# Patient Record
Sex: Female | Born: 1945 | Race: White | Hispanic: No | Marital: Married | Smoking: Never smoker
Health system: Southern US, Community
[De-identification: ages and names within clinical notes are randomized; demographics above are authoritative.]

## PROBLEM LIST (undated history)

## (undated) DIAGNOSIS — M1990 Unspecified osteoarthritis, unspecified site: Secondary | ICD-10-CM

## (undated) DIAGNOSIS — N819 Female genital prolapse, unspecified: Secondary | ICD-10-CM

## (undated) DIAGNOSIS — R3915 Urgency of urination: Secondary | ICD-10-CM

## (undated) HISTORY — PX: ABDOMINAL HYSTERECTOMY: SHX81

---
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
## 1973-01-03 HISTORY — PX: TUBAL LIGATION: SHX77

## 1991-01-04 HISTORY — PX: WISDOM TOOTH EXTRACTION: SHX21

## 1997-08-24 ENCOUNTER — Emergency Department (HOSPITAL_COMMUNITY): Admission: EM | Admit: 1997-08-24 | Discharge: 1997-08-24 | Payer: Self-pay | Admitting: Emergency Medicine

## 1997-08-27 ENCOUNTER — Ambulatory Visit (HOSPITAL_BASED_OUTPATIENT_CLINIC_OR_DEPARTMENT_OTHER): Admission: RE | Admit: 1997-08-27 | Discharge: 1997-08-27 | Payer: Self-pay | Admitting: Orthopedic Surgery

## 1998-01-03 HISTORY — PX: HAND SURGERY: SHX662

## 1998-06-29 ENCOUNTER — Other Ambulatory Visit: Admission: RE | Admit: 1998-06-29 | Discharge: 1998-06-29 | Payer: Self-pay | Admitting: Obstetrics and Gynecology

## 1999-08-16 ENCOUNTER — Other Ambulatory Visit: Admission: RE | Admit: 1999-08-16 | Discharge: 1999-08-16 | Payer: Self-pay | Admitting: Obstetrics and Gynecology

## 2000-11-08 ENCOUNTER — Other Ambulatory Visit: Admission: RE | Admit: 2000-11-08 | Discharge: 2000-11-08 | Payer: Self-pay | Admitting: Obstetrics and Gynecology

## 2001-12-12 ENCOUNTER — Other Ambulatory Visit: Admission: RE | Admit: 2001-12-12 | Discharge: 2001-12-12 | Payer: Self-pay | Admitting: Obstetrics and Gynecology

## 2005-06-29 ENCOUNTER — Other Ambulatory Visit: Admission: RE | Admit: 2005-06-29 | Discharge: 2005-06-29 | Payer: Self-pay | Admitting: Obstetrics and Gynecology

## 2012-01-04 LAB — HM COLONOSCOPY

## 2013-09-30 ENCOUNTER — Other Ambulatory Visit (HOSPITAL_COMMUNITY): Payer: Self-pay | Admitting: Obstetrics and Gynecology

## 2013-09-30 DIAGNOSIS — R3989 Other symptoms and signs involving the genitourinary system: Secondary | ICD-10-CM

## 2013-09-30 DIAGNOSIS — N8110 Cystocele, unspecified: Secondary | ICD-10-CM

## 2013-10-02 ENCOUNTER — Other Ambulatory Visit: Payer: Self-pay

## 2013-10-03 ENCOUNTER — Ambulatory Visit (HOSPITAL_COMMUNITY): Payer: Self-pay

## 2013-10-03 ENCOUNTER — Other Ambulatory Visit: Payer: Self-pay | Admitting: Urology

## 2013-10-03 DIAGNOSIS — N8111 Cystocele, midline: Secondary | ICD-10-CM

## 2013-10-07 ENCOUNTER — Ambulatory Visit
Admission: RE | Admit: 2013-10-07 | Discharge: 2013-10-07 | Disposition: A | Discharge status: Home / Self Care | Payer: Medicare Other | Source: Ambulatory Visit | Attending: Urology | Admitting: Urology

## 2013-10-07 DIAGNOSIS — N8111 Cystocele, midline: Secondary | ICD-10-CM

## 2013-12-03 ENCOUNTER — Other Ambulatory Visit: Payer: Self-pay | Admitting: Urology

## 2013-12-20 NOTE — Patient Instructions (Signed)
Taylor Mclaughlin  12/20/2013   Your procedure is scheduled on: 12/30/13  Report to [facility]  En[facility] and follow signs to               [place] at 5:15  AM.   Call this number if you have problems the morning of surgery [phone]   Remember:  Do not eat food or drink liquids :After Midnight.     Take these medicines the morning of surgery with A SIP OF WATER:                                You may not have any metal on your body including hair pins and              piercings  Do not wear jewelry, make-up, lotions, powders or perfumes.             Do not wear nail polish.  Do not shave  48 hours prior to surgery.              Men may shave face and neck.   Do not bring valuables to the hospital. [place].  Contacts, dentures or bridgework may not be worn into surgery.  Leave suitcase in the car. After surgery it may be brought to your room.     Patients discharged the day of surgery will not be allowed to drive home.  Name and phone number of your driver:  Special Instructions: N/A              Please read over the following fact sheets you were given: _____________________________________________________________________                                                     [place]  Before surgery, you can play an important role.  Because skin is not sterile, your skin needs to be as free of germs as possible.  You can reduce the number of germs on your skin by washing with CHG (chlorahexidine gluconate) soap before surgery.  CHG is an antiseptic cleaner which kills germs and bonds with the skin to continue killing germs even after washing. Please DO NOT use if you have an allergy to CHG or antibacterial soaps.  If your skin becomes reddened/irritated stop using the CHG and inform your nurse when you arrive at Short Stay. Do not shave (including legs and  underarms) for at least 48 hours prior to the first CHG shower.  You may shave your face. Please follow these instructions carefully:   1.  Shower with CHG Soap the night before surgery and the  morning of Surgery.   2.  If you choose to wash your hair, wash your hair first as usual with your  normal  Shampoo.   3.  After you shampoo, rinse your hair and body thoroughly to remove the  shampoo.  4.  Use CHG as you would any other liquid soap.  You can apply chg directly  to the skin and wash . Gently wash with scrungie or clean wascloth    5.  Apply the CHG Soap to your body ONLY FROM THE NECK DOWN.   Do not use on open                           Wound or open sores. Avoid contact with eyes, ears mouth and genitals (private parts).                        Genitals (private parts) with your normal soap.              6.  Wash thoroughly, paying special attention to the area where your surgery  will be performed.   7.  Thoroughly rinse your body with warm water from the neck down.   8.  DO NOT shower/wash with your normal soap after using and rinsing off  the CHG Soap .                9.  Pat yourself dry with a clean towel.             10.  Wear clean pajamas.             11.  Place clean sheets on your bed the night of your first shower and do not  sleep with pets.  Day of Surgery : Do not apply any lotions/deodorants the morning of surgery.  Please wear clean clothes to the hospital/surgery center.  FAILURE TO FOLLOW THESE INSTRUCTIONS MAY RESULT IN THE CANCELLATION OF YOUR SURGERY    PATIENT SIGNATURE_________________________________  ______________________________________________________________________    WHAT IS A BLOOD TRANSFUSION? Blood Transfusion Information  A transfusion is the replacement of blood or some of its parts. Blood is made up of multiple cells which provide different functions.  Red blood cells carry oxygen and are  used for blood loss replacement.  White blood cells fight against infection.  Platelets control bleeding.  Plasma helps clot blood.  Other blood products are available for specialized needs, such as hemophilia or other clotting disorders. BEFORE THE TRANSFUSION  Who gives blood for transfusions?   Healthy volunteers who are fully evaluated to make sure their blood is safe. This is blood bank blood. Transfusion therapy is the safest it has ever been in the practice of medicine. Before blood is taken from a donor, a complete history is taken to make sure that person has no history of diseases nor engages in risky social behavior (examples are intravenous drug use or sexual activity with multiple partners). The donor's travel history is screened to minimize risk of transmitting infections, such as malaria. The donated blood is tested for signs of infectious diseases, such as HIV and hepatitis. The blood is then tested to be sure it is compatible with you in order to minimize the chance of a transfusion reaction. If you or a relative donates blood, this is often done in anticipation of surgery and is not appropriate for emergency situations. It takes many days to process the donated blood. RISKS AND COMPLICATIONS Although transfusion therapy is very safe and saves many lives, the main dangers of transfusion include:   Getting an infectious disease.  Developing a transfusion reaction. This is an allergic reaction to something in the blood you were given. Every  precaution is taken to prevent this. The decision to have a blood transfusion has been considered carefully by your caregiver before blood is given. Blood is not given unless the benefits outweigh the risks. AFTER THE TRANSFUSION  Right after receiving a blood transfusion, you will usually feel much better and more energetic. This is especially true if your red blood cells have gotten low (anemic). The transfusion raises the level of the red  blood cells which carry oxygen, and this usually causes an energy increase.  The nurse administering the transfusion will monitor you carefully for complications. HOME CARE INSTRUCTIONS  No special instructions are needed after a transfusion. You may find your energy is better. Speak with your caregiver about any limitations on activity for underlying diseases you may have. SEEK MEDICAL CARE IF:   Your condition is not improving after your transfusion.  You develop redness or irritation at the intravenous (IV) site. SEEK IMMEDIATE MEDICAL CARE IF:  Any of the following symptoms occur over the next 12 hours:  Shaking chills.  You have a temperature by mouth above 102 F (38.9 C), not controlled by medicine.  Chest, back, or muscle pain.  People around you feel you are not acting correctly or are confused.  Shortness of breath or difficulty breathing.  Dizziness and fainting.  You get a rash or develop hives.  You have a decrease in urine output.  Your urine turns a dark color or changes to pink, red, or brown. Any of the following symptoms occur over the next 10 days:  You have a temperature by mouth above 102 F (38.9 C), not controlled by medicine.  Shortness of breath.  Weakness after normal activity.  The white part of the eye turns yellow (jaundice).  You have a decrease in the amount of urine or are urinating less often.  Your urine turns a dark color or changes to pink, red, or brown. Document Released: 12/18/1999 Document Revised: 03/14/2011 Document Reviewed: 08/06/2007 [facility] Patient Information 2014 [place], [place].  _______________________________________________________________________

## 2013-12-23 ENCOUNTER — Encounter (HOSPITAL_COMMUNITY)
Admission: RE | Admit: 2013-12-23 | Discharge: 2013-12-23 | Disposition: A | Discharge status: Home / Self Care | Payer: Medicare Other | Source: Ambulatory Visit | Attending: Urology | Admitting: Urology

## 2013-12-23 ENCOUNTER — Encounter (HOSPITAL_COMMUNITY): Payer: Self-pay

## 2013-12-23 DIAGNOSIS — Z01812 Encounter for preprocedural laboratory examination: Secondary | ICD-10-CM | POA: Insufficient documentation

## 2013-12-23 HISTORY — DX: Unspecified osteoarthritis, unspecified site: M19.90

## 2013-12-23 HISTORY — DX: Female genital prolapse, unspecified: N81.9

## 2013-12-23 HISTORY — DX: Urgency of urination: R39.15

## 2013-12-23 LAB — CBC
HEMATOCRIT: 37.3 % (ref 36.0–46.0)
Hemoglobin: 12.1 g/dL (ref 12.0–15.0)
MCH: 30.3 pg (ref 26.0–34.0)
MCHC: 32.4 g/dL (ref 30.0–36.0)
MCV: 93.3 fL (ref 78.0–100.0)
Platelets: 348 10*3/uL (ref 150–400)
RBC: 4 MIL/uL (ref 3.87–5.11)
RDW: 13.4 % (ref 11.5–15.5)
WBC: 6 10*3/uL (ref 4.0–10.5)

## 2013-12-23 LAB — ABO/RH: ABO/RH(D): B POS

## 2013-12-23 LAB — BASIC METABOLIC PANEL
Anion gap: 11 (ref 5–15)
BUN: 15 mg/dL (ref 6–23)
CALCIUM: 10.3 mg/dL (ref 8.4–10.5)
CO2: 28 meq/L (ref 19–32)
Chloride: 104 mEq/L (ref 96–112)
Creatinine, Ser: 0.62 mg/dL (ref 0.50–1.10)
GFR calc Af Amer: >90 mL/min (ref >90)
GFR calc non Af Amer: >90 mL/min (ref >90)
Glucose, Bld: 114 mg/dL — ABNORMAL HIGH (ref 70–99)
Potassium: 5.4 mEq/L — ABNORMAL HIGH (ref 3.7–5.3)
Sodium: 143 mEq/L (ref 137–147)

## 2013-12-24 LAB — URINE CULTURE
COLONY COUNT: NO GROWTH
Culture: NO GROWTH

## 2013-12-30 ENCOUNTER — Inpatient Hospital Stay (HOSPITAL_COMMUNITY): Payer: Medicare Other | Admitting: Certified Registered Nurse Anesthetist

## 2013-12-30 ENCOUNTER — Inpatient Hospital Stay (HOSPITAL_COMMUNITY)
Admission: RE | Admit: 2013-12-30 | Discharge: 2013-12-31 | DRG: 743 | Disposition: A | Discharge status: Home / Self Care | Payer: Medicare Other | Source: Ambulatory Visit | Attending: Urology | Admitting: Urology

## 2013-12-30 ENCOUNTER — Encounter (HOSPITAL_COMMUNITY)
Admission: RE | Disposition: A | Discharge status: Home / Self Care | Payer: Self-pay | Source: Ambulatory Visit | Attending: Urology

## 2013-12-30 ENCOUNTER — Encounter (HOSPITAL_COMMUNITY): Payer: Self-pay | Admitting: *Deleted

## 2013-12-30 DIAGNOSIS — N736 Female pelvic peritoneal adhesions (postinfective): Secondary | ICD-10-CM | POA: Diagnosis present

## 2013-12-30 DIAGNOSIS — M1990 Unspecified osteoarthritis, unspecified site: Secondary | ICD-10-CM | POA: Diagnosis present

## 2013-12-30 DIAGNOSIS — Z9851 Tubal ligation status: Secondary | ICD-10-CM | POA: Diagnosis not present

## 2013-12-30 DIAGNOSIS — N814 Uterovaginal prolapse, unspecified: Principal | ICD-10-CM | POA: Diagnosis present

## 2013-12-30 DIAGNOSIS — IMO0002 Reserved for concepts with insufficient information to code with codable children: Secondary | ICD-10-CM | POA: Diagnosis present

## 2013-12-30 HISTORY — PX: ROBOTIC ASSISTED LAPAROSCOPIC HYSTERECTOMY AND SALPINGECTOMY: SHX6379

## 2013-12-30 HISTORY — PX: CYSTO: SHX6284

## 2013-12-30 LAB — TYPE AND SCREEN
ABO/RH(D): B POS
ANTIBODY SCREEN: NEGATIVE

## 2013-12-30 SURGERY — ROBOTIC ASSISTED LAPAROSCOPIC HYSTERECTOMY AND SALPINGECTOMY
Anesthesia: General | Site: Ureter

## 2013-12-30 MED ORDER — DIPHENHYDRAMINE HCL 12.5 MG/5ML PO ELIX: 12.5000 mg | ORAL_SOLUTION | Freq: Four times a day (QID) | ORAL | Status: DC | PRN

## 2013-12-30 MED ORDER — SODIUM CHLORIDE 0.9 % IV SOLN: 250.0000 mL | INTRAVENOUS | Status: DC | PRN

## 2013-12-30 MED ORDER — PROMETHAZINE HCL 25 MG/ML IJ SOLN: 6.2500 mg | INTRAMUSCULAR | Status: DC | PRN

## 2013-12-30 MED ORDER — STERILE WATER FOR IRRIGATION IR SOLN
Status: DC | PRN
Start: 2013-12-30 — End: 2013-12-30
  Administered 2013-12-30: 3000 mL

## 2013-12-30 MED ORDER — FENTANYL CITRATE 0.05 MG/ML IJ SOLN
INTRAMUSCULAR | Status: DC | PRN
  Administered 2013-12-30: 50 ug via INTRAVENOUS
  Administered 2013-12-30 (×2): 100 ug via INTRAVENOUS

## 2013-12-30 MED ORDER — HYDROMORPHONE HCL 1 MG/ML IJ SOLN
INTRAMUSCULAR | Status: AC
  Administered 2013-12-30: 0.25 mg via INTRAVENOUS
  Filled 2013-12-30: qty 1

## 2013-12-30 MED ORDER — DEXAMETHASONE SODIUM PHOSPHATE 10 MG/ML IJ SOLN
INTRAMUSCULAR | Status: DC | PRN
  Administered 2013-12-30: 10 mg via INTRAVENOUS

## 2013-12-30 MED ORDER — GLYCOPYRROLATE 0.2 MG/ML IJ SOLN
INTRAMUSCULAR | Status: AC
  Filled 2013-12-30: qty 3

## 2013-12-30 MED ORDER — CEFAZOLIN SODIUM 1-5 GM-% IV SOLN
1.0000 g | Freq: Three times a day (TID) | INTRAVENOUS | Status: AC
  Administered 2013-12-30 – 2013-12-31 (×2): 1 g via INTRAVENOUS
  Filled 2013-12-30 (×2): qty 50

## 2013-12-30 MED ORDER — CEFAZOLIN SODIUM-DEXTROSE 2-3 GM-% IV SOLR
INTRAVENOUS | Status: AC
  Filled 2013-12-30: qty 50

## 2013-12-30 MED ORDER — LIDOCAINE HCL (CARDIAC) 20 MG/ML IV SOLN
INTRAVENOUS | Status: DC | PRN
  Administered 2013-12-30: 50 mg via INTRAVENOUS

## 2013-12-30 MED ORDER — ACETAMINOPHEN 10 MG/ML IV SOLN
1000.0000 mg | Freq: Once | INTRAVENOUS | Status: DC
  Filled 2013-12-30: qty 100

## 2013-12-30 MED ORDER — MIDAZOLAM HCL 2 MG/2ML IJ SOLN
INTRAMUSCULAR | Status: AC
  Filled 2013-12-30: qty 2

## 2013-12-30 MED ORDER — KETOROLAC TROMETHAMINE 30 MG/ML IJ SOLN: 30.0000 mg | Freq: Once | INTRAMUSCULAR | Status: DC | PRN

## 2013-12-30 MED ORDER — BUPIVACAINE LIPOSOME 1.3 % IJ SUSP
20.0000 mL | Freq: Once | INTRAMUSCULAR | Status: AC
  Administered 2013-12-30: 20 mL
  Filled 2013-12-30: qty 20

## 2013-12-30 MED ORDER — SENNOSIDES-DOCUSATE SODIUM 8.6-50 MG PO TABS
2.0000 | ORAL_TABLET | Freq: Every day | ORAL | Status: DC
  Administered 2013-12-30: 2 via ORAL
  Filled 2013-12-30 (×2): qty 2

## 2013-12-30 MED ORDER — DIPHENHYDRAMINE HCL 50 MG/ML IJ SOLN: 12.5000 mg | Freq: Four times a day (QID) | INTRAMUSCULAR | Status: DC | PRN

## 2013-12-30 MED ORDER — KETOROLAC TROMETHAMINE 15 MG/ML IJ SOLN
INTRAMUSCULAR | Status: AC
  Filled 2013-12-30: qty 1

## 2013-12-30 MED ORDER — OXYBUTYNIN CHLORIDE 5 MG PO TABS
5.0000 mg | ORAL_TABLET | Freq: Three times a day (TID) | ORAL | Status: DC | PRN
  Filled 2013-12-30: qty 1

## 2013-12-30 MED ORDER — SODIUM CHLORIDE 0.9 % IJ SOLN: 3.0000 mL | Freq: Two times a day (BID) | INTRAMUSCULAR | Status: DC

## 2013-12-30 MED ORDER — SODIUM CHLORIDE 0.9 % IJ SOLN
INTRAMUSCULAR | Status: AC
  Filled 2013-12-30: qty 20

## 2013-12-30 MED ORDER — ACETAMINOPHEN 10 MG/ML IV SOLN
INTRAVENOUS | Status: DC | PRN
  Administered 2013-12-30: 1000 mg via INTRAVENOUS

## 2013-12-30 MED ORDER — MIDAZOLAM HCL 5 MG/5ML IJ SOLN
INTRAMUSCULAR | Status: DC | PRN
  Administered 2013-12-30: 2 mg via INTRAVENOUS

## 2013-12-30 MED ORDER — SUCCINYLCHOLINE CHLORIDE 20 MG/ML IJ SOLN
INTRAMUSCULAR | Status: DC | PRN
  Administered 2013-12-30: 100 mg via INTRAVENOUS

## 2013-12-30 MED ORDER — HYDROMORPHONE HCL 1 MG/ML IJ SOLN
0.2500 mg | INTRAMUSCULAR | Status: DC | PRN
  Administered 2013-12-30: 0.25 mg via INTRAVENOUS
  Administered 2013-12-30: 0.5 mg via INTRAVENOUS

## 2013-12-30 MED ORDER — MIDAZOLAM HCL 5 MG/5ML IJ SOLN: INTRAMUSCULAR | Status: DC | PRN

## 2013-12-30 MED ORDER — NEOSTIGMINE METHYLSULFATE 10 MG/10ML IV SOLN
INTRAVENOUS | Status: DC | PRN
  Administered 2013-12-30: 4 mg via INTRAVENOUS

## 2013-12-30 MED ORDER — PROPOFOL 10 MG/ML IV BOLUS
INTRAVENOUS | Status: AC
  Filled 2013-12-30: qty 20

## 2013-12-30 MED ORDER — CEFAZOLIN SODIUM-DEXTROSE 2-3 GM-% IV SOLR
2.0000 g | INTRAVENOUS | Status: AC
  Administered 2013-12-30: 2 g via INTRAVENOUS

## 2013-12-30 MED ORDER — LACTATED RINGERS IV SOLN: INTRAVENOUS | Status: DC

## 2013-12-30 MED ORDER — MORPHINE SULFATE 2 MG/ML IJ SOLN: 2.0000 mg | INTRAMUSCULAR | Status: DC | PRN

## 2013-12-30 MED ORDER — GLYCOPYRROLATE 0.2 MG/ML IJ SOLN
INTRAMUSCULAR | Status: DC | PRN
  Administered 2013-12-30: .6 mg via INTRAVENOUS

## 2013-12-30 MED ORDER — ROCURONIUM BROMIDE 100 MG/10ML IV SOLN
INTRAVENOUS | Status: DC | PRN
  Administered 2013-12-30: 50 mg via INTRAVENOUS
  Administered 2013-12-30 (×2): 10 mg via INTRAVENOUS
  Administered 2013-12-30: 20 mg via INTRAVENOUS
  Administered 2013-12-30: 10 mg via INTRAVENOUS

## 2013-12-30 MED ORDER — DEXAMETHASONE SODIUM PHOSPHATE 10 MG/ML IJ SOLN
INTRAMUSCULAR | Status: AC
  Filled 2013-12-30: qty 1

## 2013-12-30 MED ORDER — LIDOCAINE HCL (CARDIAC) 20 MG/ML IV SOLN
INTRAVENOUS | Status: AC
  Filled 2013-12-30: qty 5

## 2013-12-30 MED ORDER — ROCURONIUM BROMIDE 100 MG/10ML IV SOLN
INTRAVENOUS | Status: AC
  Filled 2013-12-30: qty 1

## 2013-12-30 MED ORDER — ONDANSETRON HCL 4 MG/2ML IJ SOLN
INTRAMUSCULAR | Status: AC
  Filled 2013-12-30: qty 2

## 2013-12-30 MED ORDER — ONDANSETRON HCL 4 MG/2ML IJ SOLN
4.0000 mg | INTRAMUSCULAR | Status: DC | PRN
  Administered 2013-12-30: 4 mg via INTRAVENOUS
  Filled 2013-12-30: qty 2

## 2013-12-30 MED ORDER — OXYCODONE HCL 5 MG PO TABS: 5.0000 mg | ORAL_TABLET | ORAL | Status: DC | PRN

## 2013-12-30 MED ORDER — SODIUM CHLORIDE 0.9 % IJ SOLN: 3.0000 mL | INTRAMUSCULAR | Status: DC | PRN

## 2013-12-30 MED ORDER — LACTATED RINGERS IV SOLN
INTRAVENOUS | Status: DC | PRN
  Administered 2013-12-30 (×2): via INTRAVENOUS

## 2013-12-30 MED ORDER — FENTANYL CITRATE 0.05 MG/ML IJ SOLN
INTRAMUSCULAR | Status: AC
  Filled 2013-12-30: qty 5

## 2013-12-30 MED ORDER — SODIUM CHLORIDE 0.9 % IV SOLN
INTRAVENOUS | Status: DC
  Administered 2013-12-30 (×2): 1000 mL via INTRAVENOUS

## 2013-12-30 MED ORDER — LACTATED RINGERS IR SOLN
Status: DC | PRN
  Administered 2013-12-30: 1000 mL

## 2013-12-30 MED ORDER — KETOROLAC TROMETHAMINE 15 MG/ML IJ SOLN
15.0000 mg | Freq: Four times a day (QID) | INTRAMUSCULAR | Status: DC
  Administered 2013-12-30 – 2013-12-31 (×4): 15 mg via INTRAVENOUS
  Filled 2013-12-30 (×5): qty 1

## 2013-12-30 MED ORDER — ACETAMINOPHEN 10 MG/ML IV SOLN
1000.0000 mg | Freq: Four times a day (QID) | INTRAVENOUS | Status: AC
  Administered 2013-12-30 – 2013-12-31 (×4): 1000 mg via INTRAVENOUS
  Filled 2013-12-30 (×5): qty 100

## 2013-12-30 MED ORDER — ONDANSETRON HCL 4 MG/2ML IJ SOLN
INTRAMUSCULAR | Status: DC | PRN
  Administered 2013-12-30: 4 mg via INTRAVENOUS

## 2013-12-30 MED ORDER — NEOSTIGMINE METHYLSULFATE 10 MG/10ML IV SOLN
INTRAVENOUS | Status: AC
  Filled 2013-12-30: qty 1

## 2013-12-30 MED ORDER — PROPOFOL 10 MG/ML IV BOLUS
INTRAVENOUS | Status: DC | PRN
  Administered 2013-12-30: 100 mg via INTRAVENOUS

## 2013-12-30 SURGICAL SUPPLY — 46 items
CABLE HIGH FREQUENCY MONO STRZ (ELECTRODE) ×4 IMPLANT
CHLORAPREP W/TINT 26ML (MISCELLANEOUS) ×4 IMPLANT
CORDS BIPOLAR (ELECTRODE) ×4 IMPLANT
COVER SURGICAL LIGHT HANDLE (MISCELLANEOUS) ×6 IMPLANT
COVER TIP SHEARS 8 DVNC (MISCELLANEOUS) ×2 IMPLANT
COVER TIP SHEARS 8MM DA VINCI (MISCELLANEOUS) ×2
DRAPE SURG IRRIG POUCH 19X23 (DRAPES) ×4 IMPLANT
DRAPE TABLE BACK 44X90 PK DISP (DRAPES) ×2 IMPLANT
DRSG TEGADERM 6X8 (GAUZE/BANDAGES/DRESSINGS) ×10 IMPLANT
ELECT REM PT RETURN 9FT ADLT (ELECTROSURGICAL) ×4
ELECTRODE REM PT RTRN 9FT ADLT (ELECTROSURGICAL) ×2 IMPLANT
GAUZE SPONGE 2X2 8PLY STRL LF (GAUZE/BANDAGES/DRESSINGS) ×2 IMPLANT
GLOVE BIO SURGEON STRL SZ 6.5 (GLOVE) ×6 IMPLANT
GLOVE BIO SURGEONS STRL SZ 6.5 (GLOVE) ×2
GLOVE BIOGEL M STRL SZ7.5 (GLOVE) ×12 IMPLANT
GOWN STRL REUS W/ TWL XL LVL3 (GOWN DISPOSABLE) ×6 IMPLANT
GOWN STRL REUS W/TWL XL LVL3 (GOWN DISPOSABLE) ×12
HEMOSTAT SURGICEL 4X8 (HEMOSTASIS) ×2 IMPLANT
HOLDER FOLEY CATH W/STRAP (MISCELLANEOUS) ×4 IMPLANT
KIT ACCESSORY DA VINCI DISP (KITS) ×2
KIT ACCESSORY DVNC DISP (KITS) ×2 IMPLANT
MANIPULATOR UTERINE 4.5 ZUMI (MISCELLANEOUS) ×2 IMPLANT
MESH Y UPSYLON VAGINAL (Mesh General) ×2 IMPLANT
OCCLUDER COLPOPNEUMO (BALLOONS) ×2 IMPLANT
PACK ROBOT UROLOGY CUSTOM (CUSTOM PROCEDURE TRAY) ×4 IMPLANT
PEN SKIN MARKING BROAD (MISCELLANEOUS) ×4 IMPLANT
SET TUBE IRRIG SUCTION NO TIP (IRRIGATION / IRRIGATOR) ×4 IMPLANT
SOLUTION ELECTROLUBE (MISCELLANEOUS) ×4 IMPLANT
SPONGE GAUZE 2X2 STER 10/PKG (GAUZE/BANDAGES/DRESSINGS) ×2
SURGIFLO W/THROMBIN 8M KIT (HEMOSTASIS) ×4 IMPLANT
SUT ETHIBOND 0 (SUTURE) ×2 IMPLANT
SUT MNCRL AB 4-0 PS2 18 (SUTURE) ×4 IMPLANT
SUT PROLENE 0 CT 2 (SUTURE) ×4 IMPLANT
SUT VIC AB 0 CT1 27 (SUTURE) ×12
SUT VIC AB 0 CT1 27XBRD ANTBC (SUTURE) ×6 IMPLANT
SUT VIC AB 0 CT1 36 (SUTURE) ×2 IMPLANT
SUT VIC AB 2-0 CT2 27 (SUTURE) ×2 IMPLANT
SUT VIC AB 2-0 SH 27 (SUTURE) ×40
SUT VIC AB 2-0 SH 27X BRD (SUTURE) IMPLANT
SUT VICRYL 0 27 CT2 27 ABS (SUTURE) ×2 IMPLANT
SUT VICRYL 0 UR6 27IN ABS (SUTURE) ×6 IMPLANT
SYR 50ML LL SCALE MARK (SYRINGE) ×4 IMPLANT
SYR BULB IRRIGATION 50ML (SYRINGE) IMPLANT
TOWEL OR 17X26 10 PK STRL BLUE (TOWEL DISPOSABLE) ×6 IMPLANT
TRAY FOLEY CATH 14FRSI W/METER (CATHETERS) ×4 IMPLANT
WATER STERILE IRR 1500ML POUR (IV SOLUTION) ×8 IMPLANT

## 2013-12-30 NOTE — Anesthesia Preprocedure Evaluation (Addendum)
Anesthesia Evaluation  Patient identified by MRN, date of birth, ID band Patient awake    Reviewed: Allergy & Precautions, H&P , NPO status , Patient's Chart, lab work & pertinent test results  Airway Mallampati: II  TM Distance: >3 FB Neck ROM: Full    Dental no notable dental hx.    Pulmonary neg pulmonary ROS,  breath sounds clear to auscultation  Pulmonary exam normal       Cardiovascular negative cardio ROS  Rhythm:Regular Rate:Normal     Neuro/Psych negative neurological ROS  negative psych ROS   GI/Hepatic negative GI ROS, Neg liver ROS,   Endo/Other  negative endocrine ROS  Renal/GU negative Renal ROS  negative genitourinary   Musculoskeletal negative musculoskeletal ROS (+)   Abdominal   Peds negative pediatric ROS (+)  Hematology negative hematology ROS (+)   Anesthesia Other Findings   Reproductive/Obstetrics negative OB ROS                            Anesthesia Physical Anesthesia Plan  ASA: II  Anesthesia Plan: General   Post-op Pain Management:    Induction: Intravenous  Airway Management Planned: Oral ETT  Additional Equipment:   Intra-op Plan:   Post-operative Plan: Extubation in OR  Informed Consent: I have reviewed the patients History and Physical, chart, labs and discussed the procedure including the risks, benefits and alternatives for the proposed anesthesia with the patient or authorized representative who has indicated his/her understanding and acceptance.   Dental advisory given  Plan Discussed with: CRNA and Surgeon  Anesthesia Plan Comments:         Anesthesia Quick Evaluation

## 2013-12-30 NOTE — Op Note (Signed)
Preoperative diagnosis:  1. Symptomatic cystocele  2. Uterine prolapse  Postoperative diagnosis:  1. Same   Procedure: 1. Robotic-assisted laparoscopic supracervical hysterectomy and salpingo-ectomy 2. Robotic-assisted laparoscopic sacral colpopexy 3. Cystoscopy  Surgeon: Ardis Hughs, MD First assistant: Dr. Curt Bears, M.D.  Anesthesia: General  Complications: None  Intraoperative findings: Minimal adhesions of the bowel within the pelvis which are easily taken down.  [place] mesh used for the sacrocolpopexy.  EBL: 150 mL  Specimens: Supracervical hysterectomy and salpingo-ectomy  Indication: Taylor Mclaughlin is a 68 y.o. patient with symptomatic pelvic organ prolapse. Her physical exam demonstrated a grade 3/4 cystocele with uterine prolapse to the level of the hymen. She had no demonstrable urinary incontinence on urodynamics.  After reviewing the management options for treatment, she elected to proceed with the above surgical procedure(s). We have discussed the potential benefits and risks of the procedure, side effects of the proposed treatment, the likelihood of the patient achieving the goals of the procedure, and any potential problems that might occur during the procedure or recuperation. Informed consent has been obtained.  Description of procedure:  The patient was taken to the operating room and general anesthesia was induced.  The patient was placed in the dorsal lithotomy position, prepped and draped in the usual sterile fashion, and preoperative antibiotics were administered. A preoperative time-out was performed.   A Foley catheter was then placed and placed to gravity drainage. I then made a periumbilical incision carrying the dissection down to the patient's fascia with electrocautery.  Once in the fascia, the fascia was incised and 2-0 Prolene sutures were placed through the fascia.  Then holding up on the fascial sutures and using the visual  obturator access was gained into the peritoneum.  The abdomen was insufflated and the remaining ports placed under digital guidance.  2 ports were placed lateral to the umbilicus on the right proximally 10 cm apart.  The most lateral port was approximately 3 cm above the anterior iliac spine.  2 additional ports were placed in the patient's right side in comparable positions to the most lateral port on the right was a 12 mm port.the robot was then docked at an angle from the leg obliquely along the side of the left leg. We then began our surgery by cleaning up some of the pelvic adhesions to the small bowel and colon.  Once this was completed I started dissecting at the sacral promontory located 3 cm medial to the location where the ureter crosses over the iliac vessels at the pelvic brim. The posterior peritoneum was incised and the sacral prominence cleared off an area taking care to avoid the middle sacral vessels and the iliac branches.  I then created a posterior peritoneal tunnel starting at the sacral promontory and tunneling down the right pelvic sidewall down into the pelvis breaking back through the posterior peritoneum around the vesico-vaginal junction posteriorly.  I then continued the posterior dissection retracting down on the rectum and finding the avascular plane between the posterior vaginal wall and the rectum.  I carried this dissection down as far as I could to along the area of the perineal body. Then focused my attention to the uterus and hysterectomy.  I first started by taking the right round ligament with a series of by polar cautery.  I then dissected the anterior leaf of the broad ligament slightly more proximal and then distally down across the anterior mucosa salpinx and the internal cervical os.  I  then took of the uterine ovarian ligament on the right and dissected free thethe right salpinxwhich was ultimately removed and sent as a separate specimen.  Once the anterior leaf of the  broad ligament had been completely dissected on the patient's right and a small bladder flap had been created anteriorly attention was turned to the left side where a similar dissection was carried out.  I then turned my attention to the anterior plane between the anterior vaginal wall and the bladder.  I was able to obtain access to the avascular plane and with a combination of both monopolar cautery and blunt dissection was able to clean and nice down to the bladder neck.  I then turned my attention back to the patient's uterus and skeletonized the right uterine artery and vein and then took this with a series of bipolar moves.  I then performed a similar uterus pedicle ligation on the left.  This point I was able to identify the patient's cervix and came through supracervical with monopolar cautery.once the uterus was freed from all its attachments it was pushed into the left paracolic gutter and our attention was turned to placing the wire mesh.  Mesh was measured at approximately 6 cm anteriorly and 9 cm posteriorly and I cut this on the back table.  The mesh was then placed into the patient's abdomen through the assistant port and the anterior leaf was secured down onto the anterior vaginal wall with the apex at the bladder neck.  The posterior leaf was then secured down on the posterior vaginal wall.  These were sewn down with 2-0 Vicryl.  Between 6 and 8 were done on each side.  At this point I then went back to the previously dissected sacral promontory and posterior peritoneal tunnel and inserted a instrument through the tunnel and grasped the end of the mesh at the vaginal cuff and pull it up to the sacrum.  I then checked to ensure that the sacral mesh was not too tight by performing a vaginal exam.  I then secured the sacral leg of the mesh using a 0 Prolene.  I then reapproximated the posterior peritoneum with a 2-0 Vicryl in a running fashion around the sacral promontory.  The pelvic peritoneum was  closed using a pursestring.  A small Endo Catch bag was then gently passed through the assistant port and the uterus was placed in the bag.  The bag was then brought out through the camera port once the trochars were removed.  We then made a slightly larger extraction incision to remove the uterus.  The fascia was then closed with 0 Vicryl in a figure-of-eight fashion.  The skin was closed with 4-0 Monocryl's.  Dermabond was applied to the incision and exparel injected into the incisions.  The patient was subsequently extubated and returned to the PACU in excellent condition.   Ardis Hughs, M.D.

## 2013-12-30 NOTE — H&P (Signed)
Reason For Visit pre-op visit   History of Present Illness 68F with symptomatic POP who returns for discussion of surgery. She has a symptomatic cystocele with difficulting voiding and pelvic pain/pressure. She denies any difficulty with bowel movements. She denies any stress incontinence but does occasionally have urge with mild leakage. She underwent UDS showing no occult SUI. She had a small capacity bladder with some overactivity. She has had regular pap smears which have all been normal. She underwent a uterine ultrasound which also was normal.   Past Medical History Problems  1. History of Diverticulosis (K57.90) 2. History of arthritis (Z87.39)  Surgical History Problems  1. History of Hand Repair Opponensplasty 2. History of Oral Surgery Tooth Extraction 3. History of Tonsillectomy 4. History of Tubal Ligation  Current Meds 1. Calcium 600 + D TABS;  Therapy: (Recorded:08Sep2015) to Recorded 2. Centrum Silver TABS;  Therapy: (Recorded:08Sep2015) to Recorded 3. Fish Oil CAPS;  Therapy: (Recorded:08Sep2015) to Recorded 4. Glucosamine Chondroitin TABS;  Therapy: (Recorded:21Sep2015) to Recorded 5. Probiotic CAPS; only as needed;  Therapy: (Recorded:08Sep2015) to Recorded  Allergies Medication  1. No Known Drug Allergies  Family History Problems  1. Family history of Parkinson's disease dementia : Father  Social History Problems  1. Alcohol use (F10.99)   2 per week (1/2 glass of wine) 2. Caffeine use (F15.90) 3. Father deceased   68 yo dec parkinson's 4. Married 5. Mother deceased   2070-01-30yo suicide 6. Never a smoker 7. Number of children   2 sons- 40yo, 19 yo , 1 dtr- 57 yo  Review of Systems No changes in pts bowel habits, neurological changes, or progressive lower urinary tract symptoms.    Vitals Vital Signs [Data Includes: Last 1 Day]  Recorded: [id number] 01:19PM  Blood Pressure: 130 / 76 Temperature: 97.4 F Heart Rate: 71  Physical  Exam Constitutional: Well nourished and well developed . No acute distress.  Pulmonary: No respiratory distress.  Cardiovascular:. No peripheral edema.  Skin: Normal skin turgor, no visible rash and no visible skin lesions.  Neuro/Psych:. Mood and affect are appropriate.    Results/Data Urine [Data Includes: Last 1 Day]   [id number]  COLOR YELLOW   APPEARANCE CLEAR   SPECIFIC GRAVITY 1.025   pH 5.5   GLUCOSE NEG mg/dL  BILIRUBIN NEG   KETONE NEG mg/dL  BLOOD SMALL   PROTEIN NEG mg/dL  UROBILINOGEN 0.2 mg/dL  NITRITE NEG   LEUKOCYTE ESTERASE NEG   SQUAMOUS EPITHELIAL/HPF RARE   WBC 0-2 WBC/hpf  RBC 0-2 RBC/hpf  BACTERIA RARE   CRYSTALS NONE SEEN   CASTS NONE SEEN    Urinalysis is normal   Assessment Patient has symptomatic pelvic organ prolapse.   Plan Cystocele, midline  1. URINE CULTURE; Status:Hold For - Specimen/Data Collection,Appointment; Requested  for:10Dec2015;  2. Follow-up Schedule Surgery Office  Follow-up  Status: Complete  Done: [id number] Health Maintenance  3. UA With REFLEX; [Do Not Release]; Status:Complete;   Done: [id number] 01:11PM  Discussion/Summary The plan for this patient is to perform a robotic-assisted laparoscopic hysterectomy and sacral colpopexy. Given that she has not had any abnormal Pap smears, it is reasonable to leave the cervix and perform a supracervical hysterectomy. Further, we discussed leaving her recent tacked as well. I went over the procedure with the patient detail. I discussed some of the risks with her including failure, worsening voiding symptoms, urinary incontinence, damage to the surrounding structures including her ureters and her intestines. I also discussed with her the expected postoperative  course including hospitalization as well as recovery. The patient is well aware that this will be a proctored case by Dr. Mikle Bosworth. Having heard all of this, the patient wishes to proceed.

## 2013-12-30 NOTE — Interval H&P Note (Signed)
History and Physical Interval Note:  12/30/2013 6:37 AM  Taylor Mclaughlin  has presented today for surgery, with the diagnosis of pelvic floor prolapse  The various methods of treatment have been discussed with the patient and family. After consideration of risks, benefits and other options for treatment, the patient has consented to  Procedure(s): ROBOTIC ASSISTED LAPAROSCOPIC HYSTERECTOMY AND SACROCOLPOPEXY (N/A) as a surgical intervention .  The patient's history has been reviewed, patient examined, no change in status, stable for surgery.  I have reviewed the patient's chart and labs.  Questions were answered to the patient's satisfaction.     Louis Meckel W

## 2013-12-30 NOTE — Anesthesia Postprocedure Evaluation (Signed)
  Anesthesia Post-op Note  Patient: Taylor Mclaughlin  Procedure(s) Performed: Procedure(s) (LRB): ROBOTIC ASSISTED LAPAROSCOPIC HYSTERECTOMY AND SACROCOLPOPEXY (N/A) CYSTO (N/A)  Patient Location: PACU  Anesthesia Type: General  Level of Consciousness: awake and alert   Airway and Oxygen Therapy: Patient Spontanous Breathing  Post-op Pain: mild  Post-op Assessment: Post-op Vital signs reviewed, Patient's Cardiovascular Status Stable, Respiratory Function Stable, Patent Airway and No signs of Nausea or vomiting  Last Vitals:  Filed Vitals:   12/30/13 1800  BP: 105/49  Pulse: 63  Temp: 36.6 C  Resp: 18    Post-op Vital Signs: stable   Complications: No apparent anesthesia complications

## 2013-12-30 NOTE — Transfer of Care (Signed)
Immediate Anesthesia Transfer of Care Note  Patient: Taylor Mclaughlin  Procedure(s) Performed: Procedure(s): ROBOTIC ASSISTED LAPAROSCOPIC HYSTERECTOMY AND SACROCOLPOPEXY (N/A) CYSTO (N/A)  Patient Location: PACU  Anesthesia Type:General  Level of Consciousness: awake, alert  and oriented  Airway & Oxygen Therapy: Patient Spontanous Breathing and Patient connected to face mask oxygen  Post-op Assessment: Report given to PACU RN and Post -op Vital signs reviewed and stable  Post vital signs: Reviewed and stable  Complications: No apparent anesthesia complications

## 2013-12-31 ENCOUNTER — Encounter (HOSPITAL_COMMUNITY): Payer: Self-pay | Admitting: Urology

## 2013-12-31 LAB — HEMOGLOBIN AND HEMATOCRIT, BLOOD
HCT: 27.4 % — ABNORMAL LOW (ref 36.0–46.0)
Hemoglobin: 9.6 g/dL — ABNORMAL LOW (ref 12.0–15.0)

## 2013-12-31 MED ORDER — SENNOSIDES-DOCUSATE SODIUM 8.6-50 MG PO TABS: 2.0000 | ORAL_TABLET | Freq: Every day | ORAL | Status: DC

## 2013-12-31 MED ORDER — OXYCODONE HCL 5 MG PO TABS: 5.0000 mg | ORAL_TABLET | ORAL | Status: DC | PRN

## 2013-12-31 NOTE — Discharge Summary (Signed)
Date of admission: 12/30/2013  Date of discharge: 12/31/2013  Admission diagnosis: cystocele, uterine prolapse  Discharge diagnosis:  As above, s/p RAL hysterectomy and sacrocolpopexy  Secondary diagnoses:  Patient Active Problem List   Diagnosis Date Noted  . Cystocele 12/30/2013    History and Physical: For full details, please see admission history and physical. Briefly, Taylor Mclaughlin is a 68 y.o. year old patient with symptomatic pelvic organ prolapse.  She presented for the above surgery  Hospital Course: Patient tolerated the procedure well.  She was then transferred to the floor after an uneventful PACU stay.  Her hospital course was uncomplicated.  On POD#1  she had met discharge criteria: was eating a regular diet, was up and ambulating independently,  pain was well controlled, was voiding without a catheter, and was ready to for discharge.  PE on day of discharge: NAD Abdomen soft Incisions c/d/i Foley draining clear yellow urine  Laboratory values:   Recent Labs  12/31/13 0403  HGB 9.6*  HCT 27.4*   No results for input(s): NA, K, CL, CO2, GLUCOSE, BUN, CREATININE, CALCIUM in the last 72 hours. No results for input(s): LABPT, INR in the last 72 hours. No results for input(s): LABURIN in the last 72 hours. Results for orders placed or performed during the hospital encounter of 12/23/13  Urine culture     Status: None   Collection Time: 12/23/13 10:09 AM  Result Value Ref Range Status   Specimen Description URINE, CLEAN CATCH  Final   Special Requests NONE  Final   Culture  Setup Time   Final    12/23/2013 13:31 Performed at [place] Performed at [facility]   Final   Culture NO GROWTH Performed at [facility]   Final   Report Status 12/24/2013 FINAL  Final    Disposition: Home  Discharge instruction: The patient was instructed to be ambulatory but told to refrain from heavy lifting, strenuous  activity, or driving.   Discharge medications:   Medication List    TAKE these medications        CALCIUM-VITAMIN D PO  Take 1 tablet by mouth daily.     FISH OIL PO  Take 1 capsule by mouth daily.     GLUCOSAMINE CHONDROITIN COMPLX PO  Take 1 tablet by mouth daily.     multivitamin with minerals Tabs tablet  Take 1 tablet by mouth daily.     oxyCODONE 5 MG immediate release tablet  Commonly known as:  Oxy IR/ROXICODONE  Take 1 tablet (5 mg total) by mouth every 4 (four) hours as needed for moderate pain.     senna-docusate 8.6-50 MG per tablet  Commonly known as:  Senokot-S  Take 2 tablets by mouth at bedtime.        Followup:      Follow-up Information    Follow up with Ardis Hughs, MD On 01/13/2014.   Specialty:  Urology   Why:  1pm   Contact information:   [place] [address] [phone]

## 2013-12-31 NOTE — Care Management Note (Signed)
    Page 1 of 1   12/31/2013     11:22:56 AM CARE MANAGEMENT NOTE 12/31/2013  Patient:  Taylor Mclaughlin, Taylor Mclaughlin   Account Number:  [id number]  Date Initiated:  12/31/2013  Documentation initiated by:  Sunday Spillers  Subjective/Objective Assessment:   68 yo female admitted s/p robotic hysterectomy, salpingo-ectomy, sacral colpopexy, lysis of adhesions. PTA lived at home with spouse.     Action/Plan:   Home when stable   Anticipated DC Date:  01/03/2014   Anticipated DC Plan:  [place]  CM consult      Choice offered to / List presented to:             Status of service:  Completed, signed off Medicare Important Message given?   (If response is "NO", the following Medicare IM given date fields will be blank) Date Medicare IM given:   Medicare IM given by:   Date Additional Medicare IM given:   Additional Medicare IM given by:    Discharge Disposition:  HOME/SELF CARE  Per UR Regulation:  Reviewed for med. necessity/level of care/duration of stay  If discussed at [place] of Stay Meetings, dates discussed:    Comments:

## 2013-12-31 NOTE — Discharge Instructions (Signed)

## 2013-12-31 NOTE — Plan of Care (Signed)
Problem: Discharge Progression Outcomes Goal: Tolerating diet Outcome: Completed/Met Date Met:  12/31/13 Pt ate regular diet this am and tolerated well.

## 2013-12-31 NOTE — Progress Notes (Signed)
Pt given written discharge instructions and this nurse verbally went over the main points on the discharge paperwork. Pt also given 2 prescriptions to take home with her with instructions on the medications. Pt agreed with paperwork and has no further questions at this time. Pt signed discharge paperwork. Pt given minimal assistance to get dressed and pt wheeled out in wheelchair to her husbands car. Stand by assistance given as pt got into vehicle.

## 2015-04-28 DIAGNOSIS — M25561 Pain in right knee: Secondary | ICD-10-CM | POA: Diagnosis not present

## 2015-04-28 DIAGNOSIS — M179 Osteoarthritis of knee, unspecified: Secondary | ICD-10-CM | POA: Diagnosis not present

## 2015-07-09 DIAGNOSIS — H2513 Age-related nuclear cataract, bilateral: Secondary | ICD-10-CM | POA: Diagnosis not present

## 2015-10-09 DIAGNOSIS — Z23 Encounter for immunization: Secondary | ICD-10-CM | POA: Diagnosis not present

## 2015-11-10 DIAGNOSIS — R05 Cough: Secondary | ICD-10-CM | POA: Diagnosis not present

## 2015-11-10 DIAGNOSIS — Z1231 Encounter for screening mammogram for malignant neoplasm of breast: Secondary | ICD-10-CM | POA: Diagnosis not present

## 2015-11-10 DIAGNOSIS — M810 Age-related osteoporosis without current pathological fracture: Secondary | ICD-10-CM | POA: Diagnosis not present

## 2015-11-10 DIAGNOSIS — Z13220 Encounter for screening for lipoid disorders: Secondary | ICD-10-CM | POA: Diagnosis not present

## 2015-11-10 DIAGNOSIS — Z0000 Encounter for general adult medical examination without abnormal findings: Secondary | ICD-10-CM | POA: Diagnosis not present

## 2015-11-10 DIAGNOSIS — R0982 Postnasal drip: Secondary | ICD-10-CM | POA: Diagnosis not present

## 2016-01-19 DIAGNOSIS — R109 Unspecified abdominal pain: Secondary | ICD-10-CM | POA: Diagnosis not present

## 2016-01-19 DIAGNOSIS — R1030 Lower abdominal pain, unspecified: Secondary | ICD-10-CM | POA: Diagnosis not present

## 2016-01-28 DIAGNOSIS — D72829 Elevated white blood cell count, unspecified: Secondary | ICD-10-CM | POA: Diagnosis not present

## 2016-02-26 ENCOUNTER — Encounter: Payer: Self-pay | Admitting: Family Medicine

## 2016-02-26 ENCOUNTER — Ambulatory Visit (INDEPENDENT_AMBULATORY_CARE_PROVIDER_SITE_OTHER): Payer: PPO | Admitting: Family Medicine

## 2016-02-26 VITALS — BP 121/63 | HR 66 | Temp 98.0°F | Resp 16 | Ht 62.0 in | Wt 117.0 lb

## 2016-02-26 DIAGNOSIS — M8580 Other specified disorders of bone density and structure, unspecified site: Secondary | ICD-10-CM

## 2016-02-26 DIAGNOSIS — Z1231 Encounter for screening mammogram for malignant neoplasm of breast: Secondary | ICD-10-CM | POA: Diagnosis not present

## 2016-02-26 DIAGNOSIS — M1990 Unspecified osteoarthritis, unspecified site: Secondary | ICD-10-CM | POA: Insufficient documentation

## 2016-02-26 DIAGNOSIS — Z86010 Personal history of colonic polyps: Secondary | ICD-10-CM

## 2016-02-26 DIAGNOSIS — Z862 Personal history of diseases of the blood and blood-forming organs and certain disorders involving the immune mechanism: Secondary | ICD-10-CM

## 2016-02-26 DIAGNOSIS — R7309 Other abnormal glucose: Secondary | ICD-10-CM | POA: Diagnosis not present

## 2016-02-26 DIAGNOSIS — Z23 Encounter for immunization: Secondary | ICD-10-CM | POA: Diagnosis not present

## 2016-02-26 NOTE — Assessment & Plan Note (Signed)
New.  Pt reports she was told 'after the fact' that she had a polyp but that she was to follow up in 10 yrs- which confused her.  Pt is not interested in going back to her GI.  Is interested in pursuing Cologuard at next visit.

## 2016-02-26 NOTE — Assessment & Plan Note (Signed)
New.  Pt's most bothersome joint is her L hip.  She was previously taking glucosamine chondroitin but stopped this and pain has worsened some.  Will restart this.  Continue fish oil.  Increase Vit D.  Add Tumeric.  NSAIDs prn.  Reviewed supportive care and red flags that should prompt return.  Pt expressed understanding and is in agreement w/ plan.

## 2016-02-26 NOTE — Patient Instructions (Addendum)
Schedule your complete physical for fall (Sept/Oct) Keep up the good work on healthy diet and regular exercise- you look great! We'll plan on doing the cologuard at your next physical For joint pain- restart the Glucosamine Chondroitin, increase the Vit D to 3,000-5,000 units daily, continue the Fish Oil, and add Tumeric We'll call you with your bone density and mammogram appts Call with any questions or concerns Welcome!  We're glad to have you!!!

## 2016-02-26 NOTE — Progress Notes (Signed)
   Subjective:    Patient ID: Taylor Mclaughlin, female    DOB: 12-19-1945, 71 y.o.   MRN: [id number]  HPI New to establish.  Previous MD- [facility] since 1991  UTD on CPE (Sept/Oct)  Health Maintenance- pt had colonoscopy in 2014 w/ Dr Collene Mares (due 2019).  Hx of benign colon polyp.  Due for mammo and DEXA.    Hx of anemia- this occurred in post-op setting.  Never required blood products.  Is not nor has been on iron.  Denies excessive fatigue or changes to skin/hair/nails.  Elevated glucose- last sugar reading from 2015 was 114.  Unclear if this was fasting or not.  No personal or family hx of diabetes.    Hip pain- pt reports this is ongoing.  Will take Naproxen prn w/ some relief.   Review of Systems Denies CP, SOB, HAs, visual changes, edema, abd pain, N/V.    Objective:   Physical Exam  Constitutional: She is oriented to person, place, and time. She appears well-developed and well-nourished. No distress.  HENT:  Head: Normocephalic and atraumatic.  Eyes: Conjunctivae and EOM are normal. Pupils are equal, round, and reactive to light.  Neck: Normal range of motion. Neck supple. No thyromegaly present.  Cardiovascular: Normal rate, regular rhythm, normal heart sounds and intact distal pulses.   No murmur heard. Pulmonary/Chest: Effort normal and breath sounds normal. No respiratory distress.  Abdominal: Soft. She exhibits no distension. There is no tenderness.  Musculoskeletal: She exhibits no edema.  Lymphadenopathy:    She has no cervical adenopathy.  Neurological: She is alert and oriented to person, place, and time.  Skin: Skin is warm and dry.  Psychiatric: She has a normal mood and affect. Her behavior is normal.  Vitals reviewed.         Assessment & Plan:

## 2016-02-26 NOTE — Progress Notes (Signed)
Pre visit review using our clinic review tool, if applicable. No additional management support is needed unless otherwise documented below in the visit note. 

## 2016-02-26 NOTE — Assessment & Plan Note (Signed)
Noted on last labs but she is not sure if she was fasting.  No personal or family hx of diabetes.  Encouraged healthy diet and regular exercise.  Will follow.

## 2016-03-07 ENCOUNTER — Encounter: Payer: Self-pay | Admitting: General Practice

## 2016-04-05 ENCOUNTER — Ambulatory Visit
Admission: RE | Admit: 2016-04-05 | Discharge: 2016-04-05 | Disposition: A | Discharge status: Home / Self Care | Payer: PPO | Source: Ambulatory Visit | Attending: Family Medicine | Admitting: Family Medicine

## 2016-04-05 DIAGNOSIS — Z1231 Encounter for screening mammogram for malignant neoplasm of breast: Secondary | ICD-10-CM | POA: Diagnosis not present

## 2016-04-05 DIAGNOSIS — Z780 Asymptomatic menopausal state: Secondary | ICD-10-CM | POA: Diagnosis not present

## 2016-04-05 DIAGNOSIS — M8580 Other specified disorders of bone density and structure, unspecified site: Secondary | ICD-10-CM

## 2016-04-05 DIAGNOSIS — M810 Age-related osteoporosis without current pathological fracture: Secondary | ICD-10-CM | POA: Diagnosis not present

## 2016-04-13 ENCOUNTER — Ambulatory Visit (INDEPENDENT_AMBULATORY_CARE_PROVIDER_SITE_OTHER): Payer: PPO | Admitting: Family Medicine

## 2016-04-13 ENCOUNTER — Encounter: Payer: Self-pay | Admitting: Family Medicine

## 2016-04-13 VITALS — BP 121/83 | HR 69 | Temp 97.9°F | Resp 17 | Ht 62.0 in | Wt 115.5 lb

## 2016-04-13 DIAGNOSIS — M7062 Trochanteric bursitis, left hip: Secondary | ICD-10-CM

## 2016-04-13 DIAGNOSIS — M810 Age-related osteoporosis without current pathological fracture: Secondary | ICD-10-CM | POA: Diagnosis not present

## 2016-04-13 MED ORDER — MELOXICAM 15 MG PO TABS: 15.0000 mg | ORAL_TABLET | Freq: Every day | ORAL | 1 refills | Status: DC

## 2016-04-13 NOTE — Patient Instructions (Signed)
Follow up as needed/scheduled We'll start the Prolia process and let you know when it gets approved Start the Meloxicam once daily- take w/ food- for inflammation Ice the hip as needed Start Biotin daily for hair growth Call with any questions or concerns Hang in there!!!

## 2016-04-13 NOTE — Progress Notes (Signed)
Pre visit review using our clinic review tool, if applicable. No additional management support is needed unless otherwise documented below in the visit note. 

## 2016-04-13 NOTE — Progress Notes (Signed)
   Subjective:    Patient ID: Taylor Mclaughlin, female    DOB: 13-Oct-1945, 72 y.o.   MRN: [id number]  HPI Osteoporosis- recent DEXA shows T score of -3.  Pt was previously prescribed Boniva once monthly but is not on anything at this time w/ exception of Ca and Vit D.  L hip pain- pt had similar sxs ~2 yrs ago and required use of a cane at that time.  Resolved after time.  Unable to lie on L side due to pain, unable to put pressure w/o discomfort.  Difficulty w/ stairs.  Hx of sciatica on R which does cause her to alter her gait.  Has not tried OTC anti-inflammatories.   Review of Systems For ROS see HPI     Objective:   Physical Exam  Constitutional: She is oriented to person, place, and time. She appears well-developed and well-nourished. No distress.  Cardiovascular: Intact distal pulses.   Musculoskeletal: She exhibits tenderness (TTP over L greater trochanteric bursa). She exhibits no edema or deformity.  Neurological: She is alert and oriented to person, place, and time.  Skin: Skin is warm and dry. No erythema.  Psychiatric: She has a normal mood and affect. Her behavior is normal. Thought content normal.  Vitals reviewed.         Assessment & Plan:

## 2016-04-14 DIAGNOSIS — M810 Age-related osteoporosis without current pathological fracture: Secondary | ICD-10-CM | POA: Insufficient documentation

## 2016-04-14 DIAGNOSIS — M7062 Trochanteric bursitis, left hip: Secondary | ICD-10-CM | POA: Insufficient documentation

## 2016-04-14 NOTE — Assessment & Plan Note (Signed)
New.  Pt's sxs and PE consistent w/ bursitis.  Start daily Meloxicam.  Ice.  If no improvement will refer to ortho for evaluation and tx.  Pt expressed understanding and is in agreement w/ plan.

## 2016-04-14 NOTE — Assessment & Plan Note (Signed)
Pt's recent DEXA shows osteoporosis w/ T score of -3.  After discussion of tx options, she wants to proceed w/ Prolia injxns.  Will arrange for her.

## 2016-05-17 ENCOUNTER — Telehealth: Payer: Self-pay | Admitting: General Practice

## 2016-05-17 NOTE — Telephone Encounter (Signed)
Received Prolia authorization today. Will order med and call pt to schedule.

## 2016-05-31 ENCOUNTER — Encounter: Payer: Self-pay | Admitting: Family Medicine

## 2016-06-01 ENCOUNTER — Ambulatory Visit (INDEPENDENT_AMBULATORY_CARE_PROVIDER_SITE_OTHER): Payer: PPO | Admitting: *Deleted

## 2016-06-01 DIAGNOSIS — M810 Age-related osteoporosis without current pathological fracture: Secondary | ICD-10-CM | POA: Diagnosis not present

## 2016-06-01 MED ORDER — DENOSUMAB 60 MG/ML ~~LOC~~ SOLN
60.0000 mg | Freq: Once | SUBCUTANEOUS | Status: AC
  Administered 2016-06-01: 60 mg via SUBCUTANEOUS

## 2016-06-09 ENCOUNTER — Other Ambulatory Visit: Payer: Self-pay | Admitting: Family Medicine

## 2016-07-11 DIAGNOSIS — H2513 Age-related nuclear cataract, bilateral: Secondary | ICD-10-CM | POA: Diagnosis not present

## 2016-08-05 ENCOUNTER — Other Ambulatory Visit: Payer: Self-pay | Admitting: Family Medicine

## 2016-09-23 ENCOUNTER — Telehealth: Payer: Self-pay | Admitting: Family Medicine

## 2016-09-23 ENCOUNTER — Encounter: Payer: Self-pay | Admitting: Family Medicine

## 2016-09-23 ENCOUNTER — Ambulatory Visit (INDEPENDENT_AMBULATORY_CARE_PROVIDER_SITE_OTHER): Payer: PPO | Admitting: Family Medicine

## 2016-09-23 VITALS — BP 130/78 | HR 66 | Temp 98.0°F | Resp 16 | Ht 62.0 in | Wt 113.1 lb

## 2016-09-23 DIAGNOSIS — H539 Unspecified visual disturbance: Secondary | ICD-10-CM | POA: Diagnosis not present

## 2016-09-23 NOTE — Telephone Encounter (Signed)
[facility] Patient Name: NALAH MACIOCE DOB: [birth year] Initial Comment Caller States her blood pressure has been running higher then normal, would like to come in and have it checked Nurse Assessment Nurse: Dimas Chyle, RN, Dellis Filbert Date/Time Eilene Ghazi Time): 09/23/2016 8:19:18 AM Confirm and document reason for call. If symptomatic, describe symptoms. ---Caller states her blood pressure has been running higher then normal, would like to come in and have it checked. Last night B/P was 142/73. Current 137/73. Does the patient have any new or worsening symptoms? ---Yes Will a triage be completed? ---Yes Related visit to physician within the last 2 weeks? ---No Does the PT have any chronic conditions? (i.e. diabetes, asthma, etc.) ---No Is this a behavioral health or substance abuse call? ---No Guidelines Guideline Title Affirmed Question Affirmed Notes High Blood Pressure [6] Systolic BP >= 834 OR Diastolic >= 80 AND [1] not taking BP medications Final Disposition User See PCP within 2 Unk Pinto, RN, Dellis Filbert Comments Appointment scheduled with PCP for 09/23/16 at 2:15 p.m. Caller Disagree/Comply Comply Caller Understands Yes PreDisposition Call Doctor

## 2016-09-23 NOTE — Patient Instructions (Signed)
Follow up as needed or as scheduled Please call Dr Oswaldo Conroy on Monday and get an appt to evaluate the eye If you have any symptoms this weekend- visual changes, severe headache, numbness or tingling, weakness, etc- please go to the ER to be seen Call with any questions or concerns Hang in there!!!

## 2016-09-23 NOTE — Telephone Encounter (Signed)
FYI

## 2016-09-23 NOTE — Progress Notes (Signed)
Pre visit review using our clinic review tool, if applicable. No additional management support is needed unless otherwise documented below in the visit note. 

## 2016-09-23 NOTE — Progress Notes (Signed)
   Subjective:    Patient ID: Taylor Mclaughlin, female    DOB: Mar 26, 1945, 71 y.o.   MRN: [id number]  HPI Visual changes- last night 'my R eye just went crazy'.  Pt reports R eye was blurry and 'doing something weird around the edges'.  L eye remained in focus.  Pt was 'really freaked out'.  sxs seemed to clear after 3-5 minutes.  No residual sxs and no recurrent sxs.  Pt checked BP after the episode and it was 142/73.  Pt had formal eye exam this spring and 'everything was fine'.  During episode no dizziness, no CP, no slurring of speech, no facial droop or drooling.   Review of Systems For ROS see HPI     Objective:   Physical Exam  Constitutional: She is oriented to person, place, and time. She appears well-developed and well-nourished. No distress.  HENT:  Head: Normocephalic and atraumatic.  Eyes: Pupils are equal, round, and reactive to light. Conjunctivae and EOM are normal. Right eye exhibits no discharge. Left eye exhibits no discharge.  Neurological: She is alert and oriented to person, place, and time. She has normal reflexes. No cranial nerve deficit. Coordination normal.  Skin: Skin is warm and dry.  Psychiatric: She has a normal mood and affect. Her behavior is normal. Thought content normal.  Vitals reviewed.         Assessment & Plan:  Transient visual disturbance- new.  Pt had a 3-5 minute episode last night during which time her vision was 'really weird' and 'blurry around the edges'.  Denies visual field cut.  sxs resolved spontaneously and she had no associated neuro sxs that would indicate TIA or stroke.  She is asymptomatic today- although appropriately anxious about the episode.  Discussed that her mild BP elevation would not be responsible for this.  She is going to call her eye doctor and get an appt on Monday but in the interim, if she has any recurrence of sxs, she will go to ER immediately.  Pt expressed understanding and is in agreement w/ plan.

## 2016-09-26 DIAGNOSIS — G43B1 Ophthalmoplegic migraine, intractable: Secondary | ICD-10-CM | POA: Diagnosis not present

## 2016-10-04 ENCOUNTER — Ambulatory Visit (INDEPENDENT_AMBULATORY_CARE_PROVIDER_SITE_OTHER): Payer: PPO

## 2016-10-04 DIAGNOSIS — Z23 Encounter for immunization: Secondary | ICD-10-CM

## 2016-10-05 ENCOUNTER — Other Ambulatory Visit: Payer: Self-pay | Admitting: Family Medicine

## 2016-10-25 ENCOUNTER — Encounter: Payer: Self-pay | Admitting: Family Medicine

## 2016-10-26 ENCOUNTER — Ambulatory Visit: Payer: PPO | Admitting: Family Medicine

## 2016-11-04 ENCOUNTER — Other Ambulatory Visit: Payer: Self-pay | Admitting: General Practice

## 2016-11-04 MED ORDER — MELOXICAM 15 MG PO TABS: 15.0000 mg | ORAL_TABLET | Freq: Every day | ORAL | 0 refills | Status: DC

## 2017-01-28 ENCOUNTER — Encounter: Payer: Self-pay | Admitting: Family Medicine

## 2017-02-01 ENCOUNTER — Ambulatory Visit (INDEPENDENT_AMBULATORY_CARE_PROVIDER_SITE_OTHER): Payer: PPO | Admitting: Family Medicine

## 2017-02-01 ENCOUNTER — Encounter: Payer: Self-pay | Admitting: Family Medicine

## 2017-02-01 ENCOUNTER — Other Ambulatory Visit: Payer: Self-pay

## 2017-02-01 ENCOUNTER — Ambulatory Visit (INDEPENDENT_AMBULATORY_CARE_PROVIDER_SITE_OTHER): Payer: PPO

## 2017-02-01 VITALS — BP 130/81 | HR 63 | Temp 98.0°F | Resp 16 | Ht 62.0 in | Wt 110.4 lb

## 2017-02-01 VITALS — BP 130/81 | HR 63 | Temp 98.0°F | Resp 16 | Wt 110.0 lb

## 2017-02-01 DIAGNOSIS — D2230 Melanocytic nevi of unspecified part of face: Secondary | ICD-10-CM | POA: Diagnosis not present

## 2017-02-01 DIAGNOSIS — Z0000 Encounter for general adult medical examination without abnormal findings: Secondary | ICD-10-CM | POA: Insufficient documentation

## 2017-02-01 DIAGNOSIS — Z9189 Other specified personal risk factors, not elsewhere classified: Secondary | ICD-10-CM | POA: Diagnosis not present

## 2017-02-01 DIAGNOSIS — R7309 Other abnormal glucose: Secondary | ICD-10-CM

## 2017-02-01 DIAGNOSIS — M810 Age-related osteoporosis without current pathological fracture: Secondary | ICD-10-CM | POA: Diagnosis not present

## 2017-02-01 DIAGNOSIS — Z23 Encounter for immunization: Secondary | ICD-10-CM

## 2017-02-01 DIAGNOSIS — D649 Anemia, unspecified: Secondary | ICD-10-CM

## 2017-02-01 DIAGNOSIS — H9193 Unspecified hearing loss, bilateral: Secondary | ICD-10-CM

## 2017-02-01 DIAGNOSIS — Z1159 Encounter for screening for other viral diseases: Secondary | ICD-10-CM | POA: Diagnosis not present

## 2017-02-01 DIAGNOSIS — H9190 Unspecified hearing loss, unspecified ear: Secondary | ICD-10-CM | POA: Insufficient documentation

## 2017-02-01 LAB — LIPID PANEL
CHOL/HDL RATIO: 3
CHOLESTEROL: 196 mg/dL (ref 0–200)
HDL: 64.7 mg/dL (ref >39.00)
LDL CALC: 116 mg/dL — AB (ref 0–99)
NONHDL: 131.63
Triglycerides: 77 mg/dL (ref 0.0–149.0)
VLDL: 15.4 mg/dL (ref 0.0–40.0)

## 2017-02-01 LAB — CBC WITH DIFFERENTIAL/PLATELET
BASOS ABS: 0 10*3/uL (ref 0.0–0.1)
Basophils Relative: 0.6 % (ref 0.0–3.0)
EOS PCT: 1.4 % (ref 0.0–5.0)
Eosinophils Absolute: 0.1 10*3/uL (ref 0.0–0.7)
HEMATOCRIT: 36.6 % (ref 36.0–46.0)
Hemoglobin: 12.2 g/dL (ref 12.0–15.0)
LYMPHS PCT: 28.8 % (ref 12.0–46.0)
Lymphs Abs: 1.6 10*3/uL (ref 0.7–4.0)
MCHC: 33.3 g/dL (ref 30.0–36.0)
MCV: 91.9 fl (ref 78.0–100.0)
MONOS PCT: 9.3 % (ref 3.0–12.0)
Monocytes Absolute: 0.5 10*3/uL (ref 0.1–1.0)
NEUTROS ABS: 3.3 10*3/uL (ref 1.4–7.7)
Neutrophils Relative %: 59.9 % (ref 43.0–77.0)
PLATELETS: 339 10*3/uL (ref 150.0–400.0)
RBC: 3.98 Mil/uL (ref 3.87–5.11)
RDW: 12.7 % (ref 11.5–15.5)
WBC: 5.5 10*3/uL (ref 4.0–10.5)

## 2017-02-01 LAB — BASIC METABOLIC PANEL
BUN: 17 mg/dL (ref 6–23)
CALCIUM: 9.5 mg/dL (ref 8.4–10.5)
CO2: 29 mEq/L (ref 19–32)
Chloride: 101 mEq/L (ref 96–112)
Creatinine, Ser: 0.58 mg/dL (ref 0.40–1.20)
GFR: 108.75 mL/min (ref >60.00)
GLUCOSE: 95 mg/dL (ref 70–99)
POTASSIUM: 4.3 meq/L (ref 3.5–5.1)
SODIUM: 138 meq/L (ref 135–145)

## 2017-02-01 LAB — HEPATIC FUNCTION PANEL
ALT: 13 U/L (ref 0–35)
AST: 23 U/L (ref 0–37)
Albumin: 4.5 g/dL (ref 3.5–5.2)
Alkaline Phosphatase: 61 U/L (ref 39–117)
BILIRUBIN DIRECT: 0.2 mg/dL (ref 0.0–0.3)
TOTAL PROTEIN: 7.5 g/dL (ref 6.0–8.3)
Total Bilirubin: 0.9 mg/dL (ref 0.2–1.2)

## 2017-02-01 LAB — TSH: TSH: 0.91 u[IU]/mL (ref 0.35–4.50)

## 2017-02-01 LAB — VITAMIN D 25 HYDROXY (VIT D DEFICIENCY, FRACTURES): VITD: 56.41 ng/mL (ref 30.00–100.00)

## 2017-02-01 MED ORDER — ZOSTER VAC RECOMB ADJUVANTED 50 MCG/0.5ML IM SUSR: 0.5000 mL | Freq: Once | INTRAMUSCULAR | 1 refills | Status: AC

## 2017-02-01 NOTE — Progress Notes (Signed)
   Subjective:    Patient ID: Taylor Mclaughlin, female    DOB: 04/26/1945, 72 y.o.   MRN: [id number]  HPI CPE- UTD on colonoscopy, mammo, flu, and pneumonia vaccines.   Review of Systems Patient reports no vision changes, adenopathy,fever, weight change,  persistant/recurrent hoarseness , swallowing issues, chest pain, palpitations, edema, persistant/recurrent cough, hemoptysis, dyspnea (rest/exertional/paroxysmal nocturnal), gastrointestinal bleeding (melena, rectal bleeding), abdominal pain, significant heartburn, bowel changes, GU symptoms (dysuria, hematuria, incontinence), Gyn symptoms (abnormal  bleeding, pain),  syncope, focal weakness, memory loss, numbness & tingling, skin/hair/nail changes, abnormal bruising or bleeding, anxiety, or depression.   + hearing loss bilaterally    Objective:   Physical Exam General Appearance:    Alert, cooperative, no distress, appears stated age  Head:    Normocephalic, without obvious abnormality, atraumatic  Eyes:    PERRL, conjunctiva/corneas clear, EOM's intact, fundi    benign, both eyes  Ears:    Normal TM's and external ear canals, both ears  Nose:   Nares normal, septum midline, mucosa normal, no drainage    or sinus tenderness  Throat:   Lips, mucosa, and tongue normal; teeth and gums normal  Neck:   Supple, symmetrical, trachea midline, no adenopathy;    Thyroid: no enlargement/tenderness/nodules  Back:     Symmetric, no curvature, ROM normal, no CVA tenderness  Lungs:     Clear to auscultation bilaterally, respirations unlabored  Chest Wall:    No tenderness or deformity   Heart:    Regular rate and rhythm, S1 and S2 normal, no murmur, rub   or gallop  Breast Exam:    Deferred to mammo  Abdomen:     Soft, non-tender, bowel sounds active all four quadrants,    no masses, no organomegaly  Genitalia:    Deferred  Rectal:    Extremities:   Extremities normal, atraumatic, no cyanosis or edema  Pulses:   2+ and symmetric all extremities    Skin:   Skin color, texture, turgor normal, no rashes.  Crescent shaped nevus on R mandible  Lymph nodes:   Cervical, supraclavicular, and axillary nodes normal  Neurologic:   CNII-XII intact, normal strength, sensation and reflexes    throughout          Assessment & Plan:

## 2017-02-01 NOTE — Assessment & Plan Note (Signed)
Pt has hx of this.  Check labs and start tx prn.  Pt expressed understanding and is in agreement w/ plan.

## 2017-02-01 NOTE — Assessment & Plan Note (Signed)
Chronic problem.  Pt is overdue for Prolia.  She would like [place] to go ahead w/ prior authorization.  Check Vit D.  Replete prn.

## 2017-02-01 NOTE — Assessment & Plan Note (Signed)
New.  Refer to audiology

## 2017-02-01 NOTE — Assessment & Plan Note (Signed)
Pt's PE WNL w/ exception of atypical Nevus on R mandible.  UTD on mammo, colonoscopy, immunizations.  Check labs.  Anticipatory guidance provided.

## 2017-02-01 NOTE — Patient Instructions (Addendum)
Shingles vaccine at pharmacy.   Continue doing brain stimulating activities (puzzles, reading, adult coloring books, staying active) to keep memory sharp.   Bring a copy of your living will and/or healthcare power of attorney to your next office visit.   Fall Prevention in the Home Falls can cause injuries. They can happen to people of all ages. There are many things you can do to make your home safe and to help prevent falls. What can I do on the outside of my home?  Regularly fix the edges of walkways and driveways and fix any cracks.  Remove anything that might make you trip as you walk through a door, such as a raised step or threshold.  Trim any bushes or trees on the path to your home.  Use bright outdoor lighting.  Clear any walking paths of anything that might make someone trip, such as rocks or tools.  Regularly check to see if handrails are loose or broken. Make sure that both sides of any steps have handrails.  Any raised decks and porches should have guardrails on the edges.  Have any leaves, snow, or ice cleared regularly.  Use sand or salt on walking paths during winter.  Clean up any spills in your garage right away. This includes oil or grease spills. What can I do in the bathroom?  Use night lights.  Install grab bars by the toilet and in the tub and shower. Do not use towel bars as grab bars.  Use non-skid mats or decals in the tub or shower.  If you need to sit down in the shower, use a plastic, non-slip stool.  Keep the floor dry. Clean up any water that spills on the floor as soon as it happens.  Remove soap buildup in the tub or shower regularly.  Attach bath mats securely with double-sided non-slip rug tape.  Do not have throw rugs and other things on the floor that can make you trip. What can I do in the bedroom?  Use night lights.  Make sure that you have a light by your bed that is easy to reach.  Do not use any sheets or blankets that  are too big for your bed. They should not hang down onto the floor.  Have a firm chair that has side arms. You can use this for support while you get dressed.  Do not have throw rugs and other things on the floor that can make you trip. What can I do in the kitchen?  Clean up any spills right away.  Avoid walking on wet floors.  Keep items that you use a lot in easy-to-reach places.  If you need to reach something above you, use a strong step stool that has a grab bar.  Keep electrical cords out of the way.  Do not use floor polish or wax that makes floors slippery. If you must use wax, use non-skid floor wax.  Do not have throw rugs and other things on the floor that can make you trip. What can I do with my stairs?  Do not leave any items on the stairs.  Make sure that there are handrails on both sides of the stairs and use them. Fix handrails that are broken or loose. Make sure that handrails are as long as the stairways.  Check any carpeting to make sure that it is firmly attached to the stairs. Fix any carpet that is loose or worn.  Avoid having throw rugs at the top  or bottom of the stairs. If you do have throw rugs, attach them to the floor with carpet tape.  Make sure that you have a light switch at the top of the stairs and the bottom of the stairs. If you do not have them, ask someone to add them for you. What else can I do to help prevent falls?  Wear shoes that: ? Do not have high heels. ? Have rubber bottoms. ? Are comfortable and fit you well. ? Are closed at the toe. Do not wear sandals.  If you use a stepladder: ? Make sure that it is fully opened. Do not climb a closed stepladder. ? Make sure that both sides of the stepladder are locked into place. ? Ask someone to hold it for you, if possible.  Clearly mark and make sure that you can see: ? Any grab bars or handrails. ? First and last steps. ? Where the edge of each step is.  Use tools that help you  move around (mobility aids) if they are needed. These include: ? Canes. ? Walkers. ? Scooters. ? Crutches.  Turn on the lights when you go into a dark area. Replace any light bulbs as soon as they burn out.  Set up your furniture so you have a clear path. Avoid moving your furniture around.  If any of your floors are uneven, fix them.  If there are any pets around you, be aware of where they are.  Review your medicines with your doctor. Some medicines can make you feel dizzy. This can increase your chance of falling. Ask your doctor what other things that you can do to help prevent falls. This information is not intended to replace advice given to you by your health care provider. Make sure you discuss any questions you have with your health care provider. Document Released: 10/16/2008 Document Revised: 05/28/2015 Document Reviewed: 01/24/2014 Elsevier Interactive Patient Education  2018 [place] Maintenance, Female Adopting a healthy lifestyle and getting preventive care can go a long way to promote health and wellness. Talk with your health care provider about what schedule of regular examinations is right for you. This is a good chance for you to check in with your provider about disease prevention and staying healthy. In between checkups, there are plenty of things you can do on your own. Experts have done a lot of research about which lifestyle changes and preventive measures are most likely to keep you healthy. Ask your health care provider for more information. Weight and diet Eat a healthy diet  Be sure to include plenty of vegetables, fruits, low-fat dairy products, and lean protein.  Do not eat a lot of foods high in solid fats, added sugars, or salt.  Get regular exercise. This is one of the most important things you can do for your health. ? Most adults should exercise for at least 150 minutes each week. The exercise should increase your heart rate and make  you sweat (moderate-intensity exercise). ? Most adults should also do strengthening exercises at least twice a week. This is in addition to the moderate-intensity exercise.  Maintain a healthy weight  Body mass index (BMI) is a measurement that can be used to identify possible weight problems. It estimates body fat based on height and weight. Your health care provider can help determine your BMI and help you achieve or maintain a healthy weight.  For females 90 years of age and older: ? A BMI below 18.5 is  considered underweight. ? A BMI of 18.5 to 24.9 is normal. ? A BMI of 25 to 29.9 is considered overweight. ? A BMI of 30 and above is considered obese.  Watch levels of cholesterol and blood lipids  You should start having your blood tested for lipids and cholesterol at 72 years of age, then have this test every 5 years.  You may need to have your cholesterol levels checked more often if: ? Your lipid or cholesterol levels are high. ? You are older than 72 years of age. ? You are at high risk for heart disease.  Cancer screening Lung Cancer  Lung cancer screening is recommended for adults 32-16 years old who are at high risk for lung cancer because of a history of smoking.  A yearly low-dose CT scan of the lungs is recommended for people who: ? Currently smoke. ? Have quit within the past 15 years. ? Have at least a 30-pack-year history of smoking. A pack year is smoking an average of one pack of cigarettes a day for 1 year.  Yearly screening should continue until it has been 15 years since you quit.  Yearly screening should stop if you develop a health problem that would prevent you from having lung cancer treatment.  Breast Cancer  Practice breast self-awareness. This means understanding how your breasts normally appear and feel.  It also means doing regular breast self-exams. Let your health care provider know about any changes, no matter how small.  If you are in your  20s or 30s, you should have a clinical breast exam (CBE) by a health care provider every 1-3 years as part of a regular health exam.  If you are 25 or older, have a CBE every year. Also consider having a breast X-ray (mammogram) every year.  If you have a family history of breast cancer, talk to your health care provider about genetic screening.  If you are at high risk for breast cancer, talk to your health care provider about having an MRI and a mammogram every year.  Breast cancer gene (BRCA) assessment is recommended for women who have family members with BRCA-related cancers. BRCA-related cancers include: ? Breast. ? Ovarian. ? Tubal. ? Peritoneal cancers.  Results of the assessment will determine the need for genetic counseling and BRCA1 and BRCA2 testing.  Cervical Cancer Your health care provider may recommend that you be screened regularly for cancer of the pelvic organs (ovaries, uterus, and vagina). This screening involves a pelvic examination, including checking for microscopic changes to the surface of your cervix (Pap test). You may be encouraged to have this screening done every 3 years, beginning at age 50.  For women ages 9-65, health care providers may recommend pelvic exams and Pap testing every 3 years, or they may recommend the Pap and pelvic exam, combined with testing for human papilloma virus (HPV), every 5 years. Some types of HPV increase your risk of cervical cancer. Testing for HPV may also be done on women of any age with unclear Pap test results.  Other health care providers may not recommend any screening for nonpregnant women who are considered low risk for pelvic cancer and who do not have symptoms. Ask your health care provider if a screening pelvic exam is right for you.  If you have had past treatment for cervical cancer or a condition that could lead to cancer, you need Pap tests and screening for cancer for at least 20 years after your treatment. If Pap  tests have been discontinued, your risk factors (such as having a new sexual partner) need to be reassessed to determine if screening should resume. Some women have medical problems that increase the chance of getting cervical cancer. In these cases, your health care provider may recommend more frequent screening and Pap tests.  Colorectal Cancer  This type of cancer can be detected and often prevented.  Routine colorectal cancer screening usually begins at 72 years of age and continues through 72 years of age.  Your health care provider may recommend screening at an earlier age if you have risk factors for colon cancer.  Your health care provider may also recommend using home test kits to check for hidden blood in the stool.  A small camera at the end of a tube can be used to examine your colon directly (sigmoidoscopy or colonoscopy). This is done to check for the earliest forms of colorectal cancer.  Routine screening usually begins at age 65.  Direct examination of the colon should be repeated every 5-10 years through 72 years of age. However, you may need to be screened more often if early forms of precancerous polyps or small growths are found.  Skin Cancer  Check your skin from head to toe regularly.  Tell your health care provider about any new moles or changes in moles, especially if there is a change in a mole's shape or color.  Also tell your health care provider if you have a mole that is larger than the size of a pencil eraser.  Always use sunscreen. Apply sunscreen liberally and repeatedly throughout the day.  Protect yourself by wearing long sleeves, pants, a wide-brimmed hat, and sunglasses whenever you are outside.  Heart disease, diabetes, and high blood pressure  High blood pressure causes heart disease and increases the risk of stroke. High blood pressure is more likely to develop in: ? People who have blood pressure in the high end of the normal range  (130-139/85-89 mm Hg). ? People who are overweight or obese. ? People who are African American.  If you are 87-11 years of age, have your blood pressure checked every 3-5 years. If you are 85 years of age or older, have your blood pressure checked every year. You should have your blood pressure measured twice-once when you are at a hospital or clinic, and once when you are not at a hospital or clinic. Record the average of the two measurements. To check your blood pressure when you are not at a hospital or clinic, you can use: ? An automated blood pressure machine at a pharmacy. ? A home blood pressure monitor.  If you are between 74 years and 89 years old, ask your health care provider if you should take aspirin to prevent strokes.  Have regular diabetes screenings. This involves taking a blood sample to check your fasting blood sugar level. ? If you are at a normal weight and have a low risk for diabetes, have this test once every three years after 72 years of age. ? If you are overweight and have a high risk for diabetes, consider being tested at a younger age or more often. Preventing infection Hepatitis B  If you have a higher risk for hepatitis B, you should be screened for this virus. You are considered at high risk for hepatitis B if: ? You were born in a country where hepatitis B is common. Ask your health care provider which countries are considered high risk. ? Your parents were  born in a high-risk country, and you have not been immunized against hepatitis B (hepatitis B vaccine). ? You have HIV or AIDS. ? You use needles to inject street drugs. ? You live with someone who has hepatitis B. ? You have had sex with someone who has hepatitis B. ? You get hemodialysis treatment. ? You take certain medicines for conditions, including cancer, organ transplantation, and autoimmune conditions.  Hepatitis C  Blood testing is recommended for: ? Everyone born from [birth year] through  1965. ? Anyone with known risk factors for hepatitis C.  Sexually transmitted infections (STIs)  You should be screened for sexually transmitted infections (STIs) including gonorrhea and chlamydia if: ? You are sexually active and are younger than 72 years of age. ? You are older than 72 years of age and your health care provider tells you that you are at risk for this type of infection. ? Your sexual activity has changed since you were last screened and you are at an increased risk for chlamydia or gonorrhea. Ask your health care provider if you are at risk.  If you do not have HIV, but are at risk, it may be recommended that you take a prescription medicine daily to prevent HIV infection. This is called pre-exposure prophylaxis (PrEP). You are considered at risk if: ? You are sexually active and do not regularly use condoms or know the HIV status of your partner(s). ? You take drugs by injection. ? You are sexually active with a partner who has HIV.  Talk with your health care provider about whether you are at high risk of being infected with HIV. If you choose to begin PrEP, you should first be tested for HIV. You should then be tested every 3 months for as long as you are taking PrEP. Pregnancy  If you are premenopausal and you may become pregnant, ask your health care provider about preconception counseling.  If you may become pregnant, take 400 to 800 micrograms (mcg) of folic acid every day.  If you want to prevent pregnancy, talk to your health care provider about birth control (contraception). Osteoporosis and menopause  Osteoporosis is a disease in which the bones lose minerals and strength with aging. This can result in serious bone fractures. Your risk for osteoporosis can be identified using a bone density scan.  If you are 79 years of age or older, or if you are at risk for osteoporosis and fractures, ask your health care provider if you should be screened.  Ask your health  care provider whether you should take a calcium or vitamin D supplement to lower your risk for osteoporosis.  Menopause may have certain physical symptoms and risks.  Hormone replacement therapy may reduce some of these symptoms and risks. Talk to your health care provider about whether hormone replacement therapy is right for you. Follow these instructions at home:  Schedule regular health, dental, and eye exams.  Stay current with your immunizations.  Do not use any tobacco products including cigarettes, chewing tobacco, or electronic cigarettes.  If you are pregnant, do not drink alcohol.  If you are breastfeeding, limit how much and how often you drink alcohol.  Limit alcohol intake to no more than 1 drink per day for nonpregnant women. One drink equals 12 ounces of beer, 5 ounces of wine, or 1 ounces of hard liquor.  Do not use street drugs.  Do not share needles.  Ask your health care provider for help if you need support or  information about quitting drugs.  Tell your health care provider if you often feel depressed.  Tell your health care provider if you have ever been abused or do not feel safe at home. This information is not intended to replace advice given to you by your health care provider. Make sure you discuss any questions you have with your health care provider. Document Released: 07/05/2010 Document Revised: 05/28/2015 Document Reviewed: 09/23/2014 Elsevier Interactive Patient Education  [employer].

## 2017-02-01 NOTE — Progress Notes (Addendum)
Subjective:   Taylor Mclaughlin is a 72 y.o. female who presents for Medicare Annual (Subsequent) preventive examination.  Review of Systems:  No ROS.  Medicare Wellness Visit. Additional risk factors are reflected in the social history.  Cardiac Risk Factors include: advanced age (>51men, >100 women)   Sleep patterns: Sleeps 6-8 hours, feels rested.  Home Safety/Smoke Alarms: Feels safe in home. Smoke alarms in place.  Living environment; residence and Firearm Safety: Lives with husband in 1 story home.  Seat Belt Safety/Bike Helmet: Wears seat belt.   Female:   Pap-N/A       Mammo-04/05/2016, negative.        Dexa scan-04/05/2016, osteoporosis. Starting Prolia.       CCS-Colonoscopy 2014, pt reports polyps. Recall 10 years.      Objective:     Vitals: There were no vitals taken for this visit.  There is no height or weight on file to calculate BMI.  Advanced Directives 02/01/2017 12/30/2013 12/23/2013  Does Patient Have a Medical Advance Directive? Yes Yes Yes  Type of Advance Directive Living will;Healthcare Power of [place];Living will [place];Living will  Does patient want to make changes to medical advance directive? - No - Patient declined -  Copy of [place] in Chart? No - copy requested Yes Yes    Tobacco Social History   Tobacco Use  Smoking Status Never Smoker  Smokeless Tobacco Never Used     Counseling given: Not Answered   Past Medical History:  Diagnosis Date  . Arthritis   . Prolapse of female pelvic organs   . Urgency of urination    Past Surgical History:  Procedure Laterality Date  . ABDOMINAL HYSTERECTOMY    . CYSTO N/A 12/30/2013   Procedure: CYSTO;  Surgeon: Taylor Hughs, MD;  Location: [facility];  Service: Urology;  Laterality: N/A;  . HAND SURGERY  2000   L THUMB  . ROBOTIC ASSISTED LAPAROSCOPIC HYSTERECTOMY AND SALPINGECTOMY N/A 12/30/2013   Procedure: ROBOTIC  ASSISTED LAPAROSCOPIC HYSTERECTOMY AND SACROCOLPOPEXY;  Surgeon: Taylor Hughs, MD;  Location: [facility];  Service: Urology;  Laterality: N/A;  . TONSILLECTOMY  1952  . TUBAL LIGATION  1975  . WISDOM TOOTH EXTRACTION  1993   Family History  Problem Relation Age of Onset  . Hyperlipidemia Mother   . Parkinson's disease Father    Social History   Socioeconomic History  . Marital status: Married    Spouse name: Not on file  . Number of children: Not on file  . Years of education: Not on file  . Highest education level: Not on file  Social Needs  . Financial resource strain: Not on file  . Food insecurity - worry: Not on file  . Food insecurity - inability: Not on file  . Transportation needs - medical: Not on file  . Transportation needs - non-medical: Not on file  Occupational History  . Not on file  Tobacco Use  . Smoking status: Never Smoker  . Smokeless tobacco: Never Used  Substance and Sexual Activity  . Alcohol use: Yes    Comment: OCCASIONAL  . Drug use: No  . Sexual activity: Not on file  Other Topics Concern  . Not on file  Social History Narrative  . Not on file    Outpatient Encounter Medications as of 02/01/2017  Medication Sig  . BIOTIN PO Take 5,000 mg by mouth daily.   . Calcium Carb-Cholecalciferol 600-800  MG-UNIT TABS Take by mouth.  . Calcium Carbonate-Vitamin D (OYSTER CALCIUM + D) 500-125 MG-UNIT TABS Take by mouth.  Marland Kitchen GLUCOSAMINE CHONDROITIN COMPLX PO Take 1 tablet by mouth daily.  . meloxicam (MOBIC) 15 MG tablet Take 1 tablet (15 mg total) by mouth daily.  . TURMERIC PO Take 500 mg by mouth daily.   Marland Kitchen Zoster Vaccine Adjuvanted [facility]) injection Inject 0.5 mLs into the muscle once for 1 dose.   No facility-administered encounter medications on file as of 02/01/2017.     Activities of Daily Living In your present state of health, do you have any difficulty performing the following activities: 02/01/2017 02/01/2017  Hearing? Y Taylor Mclaughlin? N N  Difficulty concentrating or making decisions? N N  Walking or climbing stairs? N N  Dressing or bathing? N N  Doing errands, shopping? N N  Preparing Food and eating ? N -  Using the Toilet? N -  In the past six months, have you accidently leaked urine? N -  Do you have problems with loss of bowel control? N -  Managing your Medications? N -  Managing your Finances? N -  Housekeeping or managing your Housekeeping? N -  Some recent data might be hidden    Patient Care Team: Taylor Minium, MD as PCP - General (Family Medicine)    Assessment:   This is a routine wellness examination for Taylor Mclaughlin.  Exercise Activities and Dietary recommendations Current Exercise Habits: Home exercise routine(Walks dog; up and down with hobbies), Type of exercise: walking, Exercise limited by: None identified   Diet (meal preparation, eat out, water intake, caffeinated beverages, dairy products, fruits and vegetables): Drinks tea and water.   Eats heart healthy (lean meat, vegetables, fruits)  Goals    . Patient Stated     Tighten core by increasing activities.        Fall Risk Fall Risk  02/01/2017 02/01/2017 02/26/2016  Falls in the past year? Yes No No  Number falls in past yr: 1 - -  Injury with Fall? No - -  Follow up Falls prevention discussed - -    Depression Screen PHQ 2/9 Scores 02/01/2017 02/01/2017 02/26/2016  PHQ - 2 Score 0 0 0  PHQ- 9 Score - 0 0     Cognitive Function MMSE - Mini Mental State Exam 02/01/2017  Orientation to time 5  Orientation to Place 5  Registration 3  Attention/ Calculation 5  Recall 3  Language- name 2 objects 2  Language- repeat 1  Language- follow 3 step command 3  Language- read & follow direction 1  Write a sentence 1  Copy design 1  Total score 30        Immunization History  Administered Date(s) Administered  . Influenza, High Dose Seasonal PF 10/09/2015  . Influenza,inj,Quad PF,6+ Mos 10/04/2016  . Pneumococcal  Conjugate-13 08/20/2013  . Pneumococcal Polysaccharide-23 04/08/2009, 02/26/2016  . Tdap 07/04/2006  . Zoster 04/20/2009    Screening Tests Health Maintenance  Topic Date Due  . Hepatitis C Screening  January 30, 1945  . TETANUS/TDAP  04/03/2017 (Originally 07/03/2016)  . MAMMOGRAM  04/06/2018  . COLONOSCOPY  01/03/2022  . INFLUENZA VACCINE  Completed  . DEXA SCAN  Completed  . PNA vac Low Risk Adult  Completed        Plan:     Shingles vaccine at pharmacy.   Continue doing brain stimulating activities (puzzles, reading, adult coloring books, staying active) to  keep memory sharp.   Bring a copy of your living will and/or healthcare power of attorney to your next office visit.  I have personally reviewed and noted the following in the patient's chart:   . Medical and social history . Use of alcohol, tobacco or illicit drugs  . Current medications and supplements . Functional ability and status . Nutritional status . Physical activity . Advanced directives . List of other physicians . Hospitalizations, surgeries, and ER visits in previous 12 months . Vitals . Screenings to include cognitive, depression, and falls . Referrals and appointments  In addition, I have reviewed and discussed with patient certain preventive protocols, quality metrics, and best practice recommendations. A written personalized care plan for preventive services as well as general preventive health recommendations were provided to patient.     Gerilyn Nestle, RN  02/01/2017  Reviewed documentation provided by RN and agree w/ above.  Annye Asa, MD

## 2017-02-01 NOTE — Assessment & Plan Note (Signed)
Noted on previous labs.  Repeat labs and determine if intervention is needed

## 2017-02-01 NOTE — Patient Instructions (Addendum)
Follow up in 1 year or as needed We'll notify you of your lab results and make any changes if needed We'll call you with your Dermatology and audiology appt Start daily Claritin or Zyrtec (store brand generic is just as good) Keep up the good work!  You look great!! Call with any questions or concerns Happy New Year!!

## 2017-02-02 ENCOUNTER — Encounter: Payer: Self-pay | Admitting: General Practice

## 2017-02-02 LAB — HEPATITIS C ANTIBODY
HEP C AB: NONREACTIVE
SIGNAL TO CUT-OFF: 0 (ref <1.00)

## 2017-03-28 ENCOUNTER — Ambulatory Visit: Payer: PPO | Attending: Family Medicine | Admitting: Audiology

## 2017-03-28 DIAGNOSIS — Z01118 Encounter for examination of ears and hearing with other abnormal findings: Secondary | ICD-10-CM | POA: Insufficient documentation

## 2017-03-28 DIAGNOSIS — H9190 Unspecified hearing loss, unspecified ear: Secondary | ICD-10-CM

## 2017-03-28 DIAGNOSIS — H903 Sensorineural hearing loss, bilateral: Secondary | ICD-10-CM | POA: Diagnosis not present

## 2017-03-28 DIAGNOSIS — R94128 Abnormal results of other function studies of ear and other special senses: Secondary | ICD-10-CM | POA: Diagnosis not present

## 2017-03-28 DIAGNOSIS — H918X9 Other specified hearing loss, unspecified ear: Secondary | ICD-10-CM | POA: Diagnosis not present

## 2017-03-28 DIAGNOSIS — H93299 Other abnormal auditory perceptions, unspecified ear: Secondary | ICD-10-CM | POA: Insufficient documentation

## 2017-03-28 NOTE — Procedures (Signed)
  Outpatient Audiology and [place], [place] [address]  PCP:   Midge Minium, MD Referred By:  Midge Minium, MD Referral Reason: Bilateral hearing loss, unspecified hearing loss type  Patient Name: Taylor Mclaughlin Patient DOB:  1945/11/21 Patient MRN:  [id number]  AUDIOLOGICAL EVALUATION  CASE HISTORY Taylor Mclaughlin is a 71 y.o. female.  Taylor Mclaughlin presented with complaints of hearing difficulties in background noise.  Her most difficult listening situations include television and restaurant situations.  Taylor Mclaughlin noted that she watches television shows with [place] and [place] actors with closed captions.  Her husband has also noticed a decline in her hearing.  She claimed that he talks while facing away from her.  Taylor Mclaughlin has occasional ringing in her ears and fullness "a high percentage" of the time.  She was unsure if her fullness was due to her post-nasal drip.  Taylor Mclaughlin has a history of childhood ear infections ("swimmer's ear") that were reportedly managed with an adenoidectomy.  She recalled a "bad" childhood earache when a physician "rolled a piece of tissue," "stuck it in her ear," and pulled out "blood."  Taylor Mclaughlin has a history of itchiness and cerumen management.  No other ear or hearing concerns.  RESULTS Otoscopy: Otoscopy revealed non-occluding cerumen with a partially visualized tympanic membrane in the right ear, and a clear ear canal with a visualized tympanic membrane in the left ear.  Tympanometry: Tympanometric values for middle ear volume, pressure, and compliance were within normal limits (Type A) in the right and left ears.  This is consistent with normal middle ear function, bilaterally.  Pure Tone Audiometry: Pure tone audiometry revealed normal hearing sloping to a mild to moderately-severe sensorineural hearing loss between [phone] in the right ear.  Normal hearing sloping to a mild to severe sensorineural hearing loss in the  left ear between [phone] in the left ear.  Speech Audiometry: Speech audiometry revealed speech reception thresholds using recorded spondee word lists at 30 dB and 25 dB in the right and left ears, respectively.  There is good agreement with the pure tone averages.  Suprathreshold word recognition scores in quiet using recorded NU-6 word lists were 100% at 60 dB HL and 96% at 55 dB HL in the right and left ears, respectively.  The scores are within the expected ranges when compared to the pure tone averages.  Speech-in-noise scores using recorded multi-talker babble were 64% and 76% at +5 dB SNR in the right and left ears, respectively.  SUMMARY Taylor Mclaughlin has a mild sloping to moderately-severe to severe sensorineural hearing loss.  As a result, she may perceive "fullness" in her ears.  Non-occluding cerumen may also cause feelings of "fullness" in addition to itchiness.  She is a candidate for binaural amplification.  The overall test reliability was judged to be good.  RECOMMENDATIONS 1. Recommend hearing aid consultation.  May benefit from amplification, adaptive noise reduction, and Bluetooth streaming technologies.  Provided a list of nearby places that offer hearing aid services.  Will pursue if and when she is ready to do so. 2. Recommend cerumen management for relief from itchiness.  Routinely, if she pursues hearing aids. 3. Discussed better listening and communication strategies, such as reducing background noise and facing others during conversations. 4. Audiological re-evaluation per Midge Minium, MD, or sooner if concerns arise.  Jason Nest, [employer] Graduate Student Clinician  Kae Heller, AuD, CCC-A Doctor of Audiology 03/28/2017

## 2017-04-19 ENCOUNTER — Other Ambulatory Visit: Payer: Self-pay | Admitting: Family Medicine

## 2017-04-19 DIAGNOSIS — Z01118 Encounter for examination of ears and hearing with other abnormal findings: Principal | ICD-10-CM

## 2017-04-19 DIAGNOSIS — R94128 Abnormal results of other function studies of ear and other special senses: Secondary | ICD-10-CM

## 2017-04-26 DIAGNOSIS — J301 Allergic rhinitis due to pollen: Secondary | ICD-10-CM | POA: Diagnosis not present

## 2017-04-26 DIAGNOSIS — H6121 Impacted cerumen, right ear: Secondary | ICD-10-CM | POA: Diagnosis not present

## 2017-04-26 DIAGNOSIS — H903 Sensorineural hearing loss, bilateral: Secondary | ICD-10-CM | POA: Diagnosis not present

## 2017-05-01 DIAGNOSIS — C44319 Basal cell carcinoma of skin of other parts of face: Secondary | ICD-10-CM | POA: Diagnosis not present

## 2017-05-01 DIAGNOSIS — L821 Other seborrheic keratosis: Secondary | ICD-10-CM | POA: Diagnosis not present

## 2017-05-01 DIAGNOSIS — D225 Melanocytic nevi of trunk: Secondary | ICD-10-CM | POA: Diagnosis not present

## 2017-05-01 DIAGNOSIS — D1801 Hemangioma of skin and subcutaneous tissue: Secondary | ICD-10-CM | POA: Diagnosis not present

## 2017-05-10 DIAGNOSIS — H6521 Chronic serous otitis media, right ear: Secondary | ICD-10-CM | POA: Diagnosis not present

## 2017-05-10 DIAGNOSIS — J301 Allergic rhinitis due to pollen: Secondary | ICD-10-CM | POA: Diagnosis not present

## 2017-05-10 DIAGNOSIS — H903 Sensorineural hearing loss, bilateral: Secondary | ICD-10-CM | POA: Diagnosis not present

## 2017-05-12 ENCOUNTER — Telehealth: Payer: Self-pay | Admitting: General Practice

## 2017-05-12 NOTE — Telephone Encounter (Signed)
Called pt to find out if she was willing to have prolia injection. Pt said she would have to think about it and would give me a call or send a mychart next week to inform.

## 2017-05-18 ENCOUNTER — Other Ambulatory Visit: Payer: Self-pay | Admitting: Family Medicine

## 2017-05-22 DIAGNOSIS — H903 Sensorineural hearing loss, bilateral: Secondary | ICD-10-CM | POA: Diagnosis not present

## 2017-05-23 ENCOUNTER — Encounter: Payer: Self-pay | Admitting: Family Medicine

## 2017-05-24 DIAGNOSIS — Z85828 Personal history of other malignant neoplasm of skin: Secondary | ICD-10-CM | POA: Diagnosis not present

## 2017-05-24 DIAGNOSIS — C44319 Basal cell carcinoma of skin of other parts of face: Secondary | ICD-10-CM | POA: Diagnosis not present

## 2017-06-13 ENCOUNTER — Encounter: Payer: Self-pay | Admitting: General Practice

## 2017-06-28 DIAGNOSIS — H2513 Age-related nuclear cataract, bilateral: Secondary | ICD-10-CM | POA: Diagnosis not present

## 2017-06-28 DIAGNOSIS — H35371 Puckering of macula, right eye: Secondary | ICD-10-CM | POA: Diagnosis not present

## 2017-06-29 ENCOUNTER — Encounter: Payer: Self-pay | Admitting: Family Medicine

## 2017-09-30 ENCOUNTER — Other Ambulatory Visit: Payer: Self-pay | Admitting: Family Medicine

## 2017-10-02 MED ORDER — MELOXICAM 15 MG PO TABS: 15.0000 mg | ORAL_TABLET | Freq: Every day | ORAL | 0 refills | Status: DC

## 2017-10-02 NOTE — Addendum Note (Signed)
Addended by: Davis Gourd on: 10/02/2017 08:50 AM   Modules accepted: Orders

## 2017-10-04 ENCOUNTER — Other Ambulatory Visit: Payer: Self-pay | Admitting: Family Medicine

## 2017-10-04 MED ORDER — MELOXICAM 15 MG PO TABS: 15.0000 mg | ORAL_TABLET | Freq: Every day | ORAL | 0 refills | Status: DC

## 2017-10-18 ENCOUNTER — Ambulatory Visit (INDEPENDENT_AMBULATORY_CARE_PROVIDER_SITE_OTHER): Payer: PPO | Admitting: *Deleted

## 2017-10-18 DIAGNOSIS — Z23 Encounter for immunization: Secondary | ICD-10-CM | POA: Diagnosis not present

## 2017-12-26 ENCOUNTER — Other Ambulatory Visit: Payer: Self-pay | Admitting: Family Medicine

## 2018-02-06 NOTE — Progress Notes (Addendum)
Subjective:   Taylor Mclaughlin is a 73 y.o. female who presents for Medicare Annual (Subsequent) preventive examination.  Review of Systems:  No ROS.  Medicare Wellness Visit. Additional risk factors are reflected in the social history.  Cardiac Risk Factors include: advanced age (>88men, >68 women)   Sleep patterns: Sleeps 7-8 hours, up to void x 1.  Home Safety/Smoke Alarms: Feels safe in home. Smoke alarms in place.  Living environment; residence and Firearm Safety: Lives with husband in 1 story home. Steps at door, no rail (doesn't use this door).  Seat Belt Safety/Bike Helmet: Wears seat belt.   Female:   Pap-N/A       Mammo-04/05/2016, negative. Ordered today, [place].  Dexa scan-04/05/2016, osteoporosis. Ordered today, [place].         CCS-Colonoscopy 2014, pt reports polyps. Recall 10 years.      Objective:     Vitals: BP 128/64 (BP Location: Left Arm, Patient Position: Sitting, Cuff Size: Normal)   Pulse 61   Temp 97.8 F (36.6 C) (Temporal)   Resp 16   Ht 5\' 2"  (1.575 m)   Wt 112 lb 4 oz (50.9 kg)   SpO2 99%   BMI 20.53 kg/m   Body mass index is 20.53 kg/m.  Advanced Directives 02/07/2018 02/01/2017 12/30/2013 12/23/2013  Does Patient Have a Medical Advance Directive? Yes Yes Yes Yes  Type of Paramedic of [place];Living will Living will;Healthcare Power of [place];Living will [place];Living will  Does patient want to make changes to medical advance directive? - - No - Patient declined -  Copy of [place] in Chart? No - copy requested No - copy requested Yes Yes    Tobacco Social History   Tobacco Use  Smoking Status Never Smoker  Smokeless Tobacco Never Used     Counseling given: Not Answered    Past Medical History:  Diagnosis Date  . Arthritis   . Prolapse of female pelvic organs   . Urgency of urination    Past  Surgical History:  Procedure Laterality Date  . ABDOMINAL HYSTERECTOMY    . CYSTO N/A 12/30/2013   Procedure: CYSTO;  Surgeon: Ardis Hughs, MD;  Location: [facility];  Service: Urology;  Laterality: N/A;  . HAND SURGERY  2000   L THUMB  . ROBOTIC ASSISTED LAPAROSCOPIC HYSTERECTOMY AND SALPINGECTOMY N/A 12/30/2013   Procedure: ROBOTIC ASSISTED LAPAROSCOPIC HYSTERECTOMY AND SACROCOLPOPEXY;  Surgeon: Ardis Hughs, MD;  Location: [facility];  Service: Urology;  Laterality: N/A;  . TONSILLECTOMY  1952  . TUBAL LIGATION  1975  . WISDOM TOOTH EXTRACTION  1993   Family History  Problem Relation Age of Onset  . Hyperlipidemia Mother   . Parkinson's disease Father   . Macular degeneration Brother   . Depression Daughter   . Depression Son    Social History   Socioeconomic History  . Marital status: Married    Spouse name: Not on file  . Number of children: Not on file  . Years of education: Not on file  . Highest education level: Not on file  Occupational History  . Not on file  Social Needs  . Financial resource strain: Not on file  . Food insecurity:    Worry: Not on file    Inability: Not on file  . Transportation needs:    Medical: Not on file    Non-medical: Not on file  Tobacco Use  . Smoking status: Never Smoker  . Smokeless tobacco: Never Used  Substance and Sexual Activity  . Alcohol use: Yes    Comment: OCCASIONAL  . Drug use: No  . Sexual activity: Not on file  Lifestyle  . Physical activity:    Days per week: Not on file    Minutes per session: Not on file  . Stress: Not on file  Relationships  . Social connections:    Talks on phone: Not on file    Gets together: Not on file    Attends religious service: Not on file    Active member of club or organization: Not on file    Attends meetings of clubs or organizations: Not on file    Relationship status: Not on file  Other Topics Concern  . Not on file  Social History Narrative  . Not on file     Outpatient Encounter Medications as of 02/07/2018  Medication Sig  . BIOTIN PO Take 5,000 mg by mouth daily.   . Calcium Carb-Cholecalciferol 600-800 MG-UNIT TABS Take by mouth.  . Calcium Carbonate-Vitamin D (OYSTER CALCIUM + D) 500-125 MG-UNIT TABS Take by mouth.  Marland Kitchen GLUCOSAMINE CHONDROITIN COMPLX PO Take 1 tablet by mouth daily.  . meloxicam (MOBIC) 15 MG tablet TAKE 1 TABLET BY MOUTH EVERY DAY  . Multiple Vitamins-Minerals (MULTIVITAMIN ADULT PO) Take 500 mg by mouth daily.  . TURMERIC PO Take 500 mg by mouth daily.    No facility-administered encounter medications on file as of 02/07/2018.     Activities of Daily Living In your present state of health, do you have any difficulty performing the following activities: 02/07/2018  Hearing? N  Vision? N  Difficulty concentrating or making decisions? N  Walking or climbing stairs? N  Dressing or bathing? N  Doing errands, shopping? N  Preparing Food and eating ? N  Using the Toilet? N  In the past six months, have you accidently leaked urine? N  Do you have problems with loss of bowel control? N  Managing your Medications? N  Managing your Finances? N  Housekeeping or managing your Housekeeping? N  Some recent data might be hidden    Patient Care Team: Midge Minium, MD as PCP - General (Family Medicine)    Assessment:   This is a routine wellness examination for Taylor Mclaughlin.  Exercise Activities and Dietary recommendations Current Exercise Habits: Home exercise routine, Type of exercise: walking(walks dogs BID), Frequency (Times/Week): 7, Exercise limited by: None identified   Diet (meal preparation, eat out, water intake, caffeinated beverages, dairy products, fruits and vegetables): Drinks water and tea.   Breakfast: oatmeal; cereal; bagel/toast; English muffin; pancakes; hot tea Lunch: soup/sandwich (half); cookies; grapes Dinner: protein, starch and vegetables; pizza; avoids red meat  Goals    . Increase physical  activity     Increase activity.     . Patient Stated     Tighten core by increasing activities.        Fall Risk Fall Risk  02/07/2018 02/01/2017 02/01/2017 02/26/2016  Falls in the past year? 0 Yes No No  Number falls in past yr: - 1 - -  Injury with Fall? - No - -  Follow up - Falls prevention discussed - -    Depression Screen PHQ 2/9 Scores 02/07/2018 02/01/2017 02/01/2017 02/26/2016  PHQ - 2 Score 0 0 0 0  PHQ- 9 Score - - 0 0     Cognitive Function MMSE - Mini Mental  State Exam 02/07/2018 02/01/2017  Orientation to time 5 5  Orientation to Place 5 5  Registration 3 3  Attention/ Calculation 5 5  Recall 3 3  Language- name 2 objects 2 2  Language- repeat 1 1  Language- follow 3 step command 3 3  Language- read & follow direction 1 1  Write a sentence 1 1  Copy design 1 1  Total score 30 30        Immunization History  Administered Date(s) Administered  . Influenza, High Dose Seasonal PF 10/09/2015, 10/18/2017  . Influenza,inj,Quad PF,6+ Mos 10/04/2016  . Pneumococcal Conjugate-13 08/20/2013  . Pneumococcal Polysaccharide-23 04/08/2009, 02/26/2016  . Tdap 07/04/2006  . Zoster 04/20/2009  . Zoster Recombinat (Shingrix) 05/16/2017    Screening Tests Health Maintenance  Topic Date Due  . TETANUS/TDAP  02/08/2019 (Originally 07/03/2016)  . MAMMOGRAM  04/06/2018  . COLONOSCOPY  01/03/2022  . INFLUENZA VACCINE  Completed  . DEXA SCAN  Completed  . Hepatitis C Screening  Completed  . PNA vac Low Risk Adult  Completed        Plan:  Schedule mammogram and bone scan after 04/07/2018.   Continue doing brain stimulating activities (puzzles, reading, adult coloring books, staying active) to keep memory sharp.   Bring a copy of your living will and/or healthcare power of attorney to your next office visit.  I have personally reviewed and noted the following in the patient's chart:   . Medical and social history . Use of alcohol, tobacco or illicit drugs  . Current  medications and supplements . Functional ability and status . Nutritional status . Physical activity . Advanced directives . List of other physicians . Hospitalizations, surgeries, and ER visits in previous 12 months . Vitals . Screenings to include cognitive, depression, and falls . Referrals and appointments  In addition, I have reviewed and discussed with patient certain preventive protocols, quality metrics, and best practice recommendations. A written personalized care plan for preventive services as well as general preventive health recommendations were provided to patient.     Gerilyn Nestle, RN  02/07/2018  Reviewed documentation provided by RN and agree w/ above.  Annye Asa, MD

## 2018-02-07 ENCOUNTER — Ambulatory Visit (INDEPENDENT_AMBULATORY_CARE_PROVIDER_SITE_OTHER): Payer: PPO

## 2018-02-07 ENCOUNTER — Encounter: Payer: Self-pay | Admitting: Family Medicine

## 2018-02-07 ENCOUNTER — Other Ambulatory Visit: Payer: Self-pay

## 2018-02-07 ENCOUNTER — Ambulatory Visit (INDEPENDENT_AMBULATORY_CARE_PROVIDER_SITE_OTHER): Payer: PPO | Admitting: Family Medicine

## 2018-02-07 VITALS — BP 128/64 | HR 61 | Temp 97.8°F | Resp 16 | Wt 112.2 lb

## 2018-02-07 VITALS — BP 128/64 | HR 61 | Temp 97.8°F | Resp 16 | Ht 62.0 in | Wt 112.2 lb

## 2018-02-07 DIAGNOSIS — Z23 Encounter for immunization: Secondary | ICD-10-CM

## 2018-02-07 DIAGNOSIS — E785 Hyperlipidemia, unspecified: Secondary | ICD-10-CM | POA: Diagnosis not present

## 2018-02-07 DIAGNOSIS — M810 Age-related osteoporosis without current pathological fracture: Secondary | ICD-10-CM

## 2018-02-07 DIAGNOSIS — E2839 Other primary ovarian failure: Secondary | ICD-10-CM

## 2018-02-07 DIAGNOSIS — M25561 Pain in right knee: Secondary | ICD-10-CM

## 2018-02-07 DIAGNOSIS — Z0000 Encounter for general adult medical examination without abnormal findings: Secondary | ICD-10-CM | POA: Diagnosis not present

## 2018-02-07 DIAGNOSIS — Z1231 Encounter for screening mammogram for malignant neoplasm of breast: Secondary | ICD-10-CM

## 2018-02-07 LAB — VITAMIN D 25 HYDROXY (VIT D DEFICIENCY, FRACTURES): VITD: 58.24 ng/mL (ref 30.00–100.00)

## 2018-02-07 LAB — CBC WITH DIFFERENTIAL/PLATELET
Basophils Absolute: 0 10*3/uL (ref 0.0–0.1)
Basophils Relative: 0.6 % (ref 0.0–3.0)
EOS PCT: 3.2 % (ref 0.0–5.0)
Eosinophils Absolute: 0.2 10*3/uL (ref 0.0–0.7)
HCT: 35.2 % — ABNORMAL LOW (ref 36.0–46.0)
HEMOGLOBIN: 11.9 g/dL — AB (ref 12.0–15.0)
LYMPHS PCT: 25 % (ref 12.0–46.0)
Lymphs Abs: 1.4 10*3/uL (ref 0.7–4.0)
MCHC: 33.9 g/dL (ref 30.0–36.0)
MCV: 92.3 fl (ref 78.0–100.0)
MONO ABS: 0.6 10*3/uL (ref 0.1–1.0)
MONOS PCT: 11.3 % (ref 3.0–12.0)
Neutro Abs: 3.3 10*3/uL (ref 1.4–7.7)
Neutrophils Relative %: 59.9 % (ref 43.0–77.0)
Platelets: 343 10*3/uL (ref 150.0–400.0)
RBC: 3.81 Mil/uL — AB (ref 3.87–5.11)
RDW: 12.9 % (ref 11.5–15.5)
WBC: 5.6 10*3/uL (ref 4.0–10.5)

## 2018-02-07 LAB — LIPID PANEL
CHOL/HDL RATIO: 3
Cholesterol: 188 mg/dL (ref 0–200)
HDL: 60.1 mg/dL (ref >39.00)
LDL CALC: 110 mg/dL — AB (ref 0–99)
NonHDL: 128.26
TRIGLYCERIDES: 90 mg/dL (ref 0.0–149.0)
VLDL: 18 mg/dL (ref 0.0–40.0)

## 2018-02-07 LAB — BASIC METABOLIC PANEL
BUN: 20 mg/dL (ref 6–23)
CALCIUM: 9.6 mg/dL (ref 8.4–10.5)
CO2: 28 mEq/L (ref 19–32)
CREATININE: 0.64 mg/dL (ref 0.40–1.20)
Chloride: 102 mEq/L (ref 96–112)
GFR: 91.07 mL/min (ref >60.00)
Glucose, Bld: 81 mg/dL (ref 70–99)
Potassium: 4 mEq/L (ref 3.5–5.1)
Sodium: 139 mEq/L (ref 135–145)

## 2018-02-07 LAB — HEPATIC FUNCTION PANEL
ALBUMIN: 4.4 g/dL (ref 3.5–5.2)
ALT: 14 U/L (ref 0–35)
AST: 24 U/L (ref 0–37)
Alkaline Phosphatase: 85 U/L (ref 39–117)
Bilirubin, Direct: 0.2 mg/dL (ref 0.0–0.3)
Total Bilirubin: 0.9 mg/dL (ref 0.2–1.2)
Total Protein: 7 g/dL (ref 6.0–8.3)

## 2018-02-07 LAB — TSH: TSH: 0.82 u[IU]/mL (ref 0.35–4.50)

## 2018-02-07 MED ORDER — TETANUS-DIPHTH-ACELL PERTUSSIS 5-2.5-18.5 LF-MCG/0.5 IM SUSP: 0.5000 mL | Freq: Once | INTRAMUSCULAR | 0 refills | Status: AC

## 2018-02-07 MED ORDER — FLUTICASONE PROPIONATE 50 MCG/ACT NA SUSP: 2.0000 | Freq: Every day | NASAL | 6 refills | Status: DC

## 2018-02-07 NOTE — Assessment & Plan Note (Signed)
Pt's PE WNL.  UTD on colonoscopy, flu and pneumonia vaccines.  Check labs.  Anticipatory guidance provided.

## 2018-02-07 NOTE — Patient Instructions (Addendum)
Schedule mammogram and bone scan after 04/07/2018.   Continue doing brain stimulating activities (puzzles, reading, adult coloring books, staying active) to keep memory sharp.   Bring a copy of your living will and/or healthcare power of attorney to your next office visit.   Health Maintenance, Female Adopting a healthy lifestyle and getting preventive care can go a long way to promote health and wellness. Talk with your health care provider about what schedule of regular examinations is right for you. This is a good chance for you to check in with your provider about disease prevention and staying healthy. In between checkups, there are plenty of things you can do on your own. Experts have done a lot of research about which lifestyle changes and preventive measures are most likely to keep you healthy. Ask your health care provider for more information. Weight and diet Eat a healthy diet  Be sure to include plenty of vegetables, fruits, low-fat dairy products, and lean protein.  Do not eat a lot of foods high in solid fats, added sugars, or salt.  Get regular exercise. This is one of the most important things you can do for your health. ? Most adults should exercise for at least 150 minutes each week. The exercise should increase your heart rate and make you sweat (moderate-intensity exercise). ? Most adults should also do strengthening exercises at least twice a week. This is in addition to the moderate-intensity exercise. Maintain a healthy weight  Body mass index (BMI) is a measurement that can be used to identify possible weight problems. It estimates body fat based on height and weight. Your health care provider can help determine your BMI and help you achieve or maintain a healthy weight.  For females 75 years of age and older: ? A BMI below 18.5 is considered underweight. ? A BMI of 18.5 to 24.9 is normal. ? A BMI of 25 to 29.9 is considered overweight. ? A BMI of 30 and above is  considered obese. Watch levels of cholesterol and blood lipids  You should start having your blood tested for lipids and cholesterol at 73 years of age, then have this test every 5 years.  You may need to have your cholesterol levels checked more often if: ? Your lipid or cholesterol levels are high. ? You are older than 73 years of age. ? You are at high risk for heart disease. Cancer screening Lung Cancer  Lung cancer screening is recommended for adults 45-96 years old who are at high risk for lung cancer because of a history of smoking.  A yearly low-dose CT scan of the lungs is recommended for people who: ? Currently smoke. ? Have quit within the past 15 years. ? Have at least a 30-pack-year history of smoking. A pack year is smoking an average of one pack of cigarettes a day for 1 year.  Yearly screening should continue until it has been 15 years since you quit.  Yearly screening should stop if you develop a health problem that would prevent you from having lung cancer treatment. Breast Cancer  Practice breast self-awareness. This means understanding how your breasts normally appear and feel.  It also means doing regular breast self-exams. Let your health care provider know about any changes, no matter how small.  If you are in your 20s or 30s, you should have a clinical breast exam (CBE) by a health care provider every 1-3 years as part of a regular health exam.  If you are 40  older, have a CBE every year. Also consider having a breast X-ray (mammogram) every year.  If you have a family history of breast cancer, talk to your health care provider about genetic screening.  If you are at high risk for breast cancer, talk to your health care provider about having an MRI and a mammogram every year.  Breast cancer gene (BRCA) assessment is recommended for women who have family members with BRCA-related cancers. BRCA-related cancers  include: ? Breast. ? Ovarian. ? Tubal. ? Peritoneal cancers.  Results of the assessment will determine the need for genetic counseling and BRCA1 and BRCA2 testing. Cervical Cancer Your health care provider may recommend that you be screened regularly for cancer of the pelvic organs (ovaries, uterus, and vagina). This screening involves a pelvic examination, including checking for microscopic changes to the surface of your cervix (Pap test). You may be encouraged to have this screening done every 3 years, beginning at age 21.  For women ages 30-65, health care providers may recommend pelvic exams and Pap testing every 3 years, or they may recommend the Pap and pelvic exam, combined with testing for human papilloma virus (HPV), every 5 years. Some types of HPV increase your risk of cervical cancer. Testing for HPV may also be done on women of any age with unclear Pap test results.  Other health care providers may not recommend any screening for nonpregnant women who are considered low risk for pelvic cancer and who do not have symptoms. Ask your health care provider if a screening pelvic exam is right for you.  If you have had past treatment for cervical cancer or a condition that could lead to cancer, you need Pap tests and screening for cancer for at least 20 years after your treatment. If Pap tests have been discontinued, your risk factors (such as having a new sexual partner) need to be reassessed to determine if screening should resume. Some women have medical problems that increase the chance of getting cervical cancer. In these cases, your health care provider may recommend more frequent screening and Pap tests. Colorectal Cancer  This type of cancer can be detected and often prevented.  Routine colorectal cancer screening usually begins at 73 years of age and continues through 73 years of age.  Your health care provider may recommend screening at an earlier age if you have risk factors for  colon cancer.  Your health care provider may also recommend using home test kits to check for hidden blood in the stool.  A small camera at the end of a tube can be used to examine your colon directly (sigmoidoscopy or colonoscopy). This is done to check for the earliest forms of colorectal cancer.  Routine screening usually begins at age 50.  Direct examination of the colon should be repeated every 5-10 years through 73 years of age. However, you may need to be screened more often if early forms of precancerous polyps or small growths are found. Skin Cancer  Check your skin from head to toe regularly.  Tell your health care provider about any new moles or changes in moles, especially if there is a change in a mole's shape or color.  Also tell your health care provider if you have a mole that is larger than the size of a pencil eraser.  Always use sunscreen. Apply sunscreen liberally and repeatedly throughout the day.  Protect yourself by wearing long sleeves, pants, a wide-brimmed hat, and sunglasses whenever you are outside. Heart disease, diabetes,   diabetes, and high blood pressure  High blood pressure causes heart disease and increases the risk of stroke. High blood pressure is more likely to develop in: ? People who have blood pressure in the high end of the normal range (130-139/85-89 mm Hg). ? People who are overweight or obese. ? People who are African American.  If you are 48-30 years of age, have your blood pressure checked every 3-5 years. If you are 63 years of age or older, have your blood pressure checked every year. You should have your blood pressure measured twice-once when you are at a hospital or clinic, and once when you are not at a hospital or clinic. Record the average of the two measurements. To check your blood pressure when you are not at a hospital or clinic, you can use: ? An automated blood pressure machine at a pharmacy. ? A home blood pressure monitor.  If you are  between 25 years and 60 years old, ask your health care provider if you should take aspirin to prevent strokes.  Have regular diabetes screenings. This involves taking a blood sample to check your fasting blood sugar level. ? If you are at a normal weight and have a low risk for diabetes, have this test once every three years after 73 years of age. ? If you are overweight and have a high risk for diabetes, consider being tested at a younger age or more often. Preventing infection Hepatitis B  If you have a higher risk for hepatitis B, you should be screened for this virus. You are considered at high risk for hepatitis B if: ? You were born in a country where hepatitis B is common. Ask your health care provider which countries are considered high risk. ? Your parents were born in a high-risk country, and you have not been immunized against hepatitis B (hepatitis B vaccine). ? You have HIV or AIDS. ? You use needles to inject street drugs. ? You live with someone who has hepatitis B. ? You have had sex with someone who has hepatitis B. ? You get hemodialysis treatment. ? You take certain medicines for conditions, including cancer, organ transplantation, and autoimmune conditions. Hepatitis C  Blood testing is recommended for: ? Everyone born from [birth year] through 1965. ? Anyone with known risk factors for hepatitis C. Sexually transmitted infections (STIs)  You should be screened for sexually transmitted infections (STIs) including gonorrhea and chlamydia if: ? You are sexually active and are younger than 73 years of age. ? You are older than 73 years of age and your health care provider tells you that you are at risk for this type of infection. ? Your sexual activity has changed since you were last screened and you are at an increased risk for chlamydia or gonorrhea. Ask your health care provider if you are at risk.  If you do not have HIV, but are at risk, it may be recommended that you take  a prescription medicine daily to prevent HIV infection. This is called pre-exposure prophylaxis (PrEP). You are considered at risk if: ? You are sexually active and do not regularly use condoms or know the HIV status of your partner(s). ? You take drugs by injection. ? You are sexually active with a partner who has HIV. Talk with your health care provider about whether you are at high risk of being infected with HIV. If you choose to begin PrEP, you should first be tested for HIV. You should then be tested  every 3 months for as long as you are taking PrEP. Pregnancy  If you are premenopausal and you may become pregnant, ask your health care provider about preconception counseling.  If you may become pregnant, take 400 to 800 micrograms (mcg) of folic acid every day.  If you want to prevent pregnancy, talk to your health care provider about birth control (contraception). Osteoporosis and menopause  Osteoporosis is a disease in which the bones lose minerals and strength with aging. This can result in serious bone fractures. Your risk for osteoporosis can be identified using a bone density scan.  If you are 75 years of age or older, or if you are at risk for osteoporosis and fractures, ask your health care provider if you should be screened.  Ask your health care provider whether you should take a calcium or vitamin D supplement to lower your risk for osteoporosis.  Menopause may have certain physical symptoms and risks.  Hormone replacement therapy may reduce some of these symptoms and risks. Talk to your health care provider about whether hormone replacement therapy is right for you. Follow these instructions at home:  Schedule regular health, dental, and eye exams.  Stay current with your immunizations.  Do not use any tobacco products including cigarettes, chewing tobacco, or electronic cigarettes.  If you are pregnant, do not drink alcohol.  If you are breastfeeding, limit how  much and how often you drink alcohol.  Limit alcohol intake to no more than 1 drink per day for nonpregnant women. One drink equals 12 ounces of beer, 5 ounces of wine, or 1 ounces of hard liquor.  Do not use street drugs.  Do not share needles.  Ask your health care provider for help if you need support or information about quitting drugs.  Tell your health care provider if you often feel depressed.  Tell your health care provider if you have ever been abused or do not feel safe at home. This information is not intended to replace advice given to you by your health care provider. Make sure you discuss any questions you have with your health care provider. Document Released: 07/05/2010 Document Revised: 05/28/2015 Document Reviewed: 09/23/2014 Elsevier Interactive Patient Education  2019 [employer].

## 2018-02-07 NOTE — Assessment & Plan Note (Signed)
Chronic problem.  Check Vit D level and replete as needed.  DEXA ordered.

## 2018-02-07 NOTE — Patient Instructions (Signed)
Follow up in 1 year or as needed We'll notify you of your lab results and make any changes if needed Keep up the good work on healthy diet and regular exercise- you look great! We'll call you with your Sports Med Appt for the knee pain Call with any questions or concerns Happy Valentine's Day!

## 2018-02-07 NOTE — Progress Notes (Signed)
   Subjective:    Patient ID: NITIKA JACKOWSKI, female    DOB: [birth year], 73 y.o.   MRN: [id number]  HPI CPE- UTD on colonoscopy, mammo and DEXA ordered during Wellness Visit.  UTD on flu, pneumonia vaccines.   Review of Systems Patient reports no vision/ hearing changes, adenopathy,fever, weight change,  persistant/recurrent hoarseness , swallowing issues, chest pain, palpitations, edema, persistant/recurrent cough, hemoptysis, dyspnea (rest/exertional/paroxysmal nocturnal), gastrointestinal bleeding (melena, rectal bleeding), abdominal pain, significant heartburn, bowel changes, GU symptoms (dysuria, hematuria, incontinence), Gyn symptoms (abnormal  bleeding, pain),  syncope, focal weakness, memory loss, numbness & tingling, skin/hair/nail changes, abnormal bruising or bleeding, anxiety, or depression.   R knee pain- intermittent    Objective:   Physical Exam General Appearance:    Alert, cooperative, no distress, appears stated age  Head:    Normocephalic, without obvious abnormality, atraumatic  Eyes:    PERRL, conjunctiva/corneas clear, EOM's intact, fundi    benign, both eyes  Ears:    Normal TM's and external ear canals, both ears  Nose:   Nares normal, septum midline, mucosa normal, no drainage    or sinus tenderness  Throat:   Lips, mucosa, and tongue normal; teeth and gums normal  Neck:   Supple, symmetrical, trachea midline, no adenopathy;    Thyroid: no enlargement/tenderness/nodules  Back:     Symmetric, no curvature, ROM normal, no CVA tenderness  Lungs:     Clear to auscultation bilaterally, respirations unlabored  Chest Wall:    No tenderness or deformity   Heart:    Regular rate and rhythm, S1 and S2 normal, no murmur, rub   or gallop  Breast Exam:    Deferred to mammo  Abdomen:     Soft, non-tender, bowel sounds active all four quadrants,    no masses, no organomegaly  Genitalia:    Deferred at pt's request  Rectal:    Extremities:   Extremities normal, atraumatic,  no cyanosis or edema  Pulses:   2+ and symmetric all extremities  Skin:   Skin color, texture, turgor normal, no rashes or lesions  Lymph nodes:   Cervical, supraclavicular, and axillary nodes normal  Neurologic:   CNII-XII intact, normal strength, sensation and reflexes    throughout          Assessment & Plan:

## 2018-02-08 ENCOUNTER — Encounter: Payer: Self-pay | Admitting: General Practice

## 2018-02-08 ENCOUNTER — Ambulatory Visit: Payer: PPO | Admitting: Sports Medicine

## 2018-02-08 DIAGNOSIS — M25561 Pain in right knee: Secondary | ICD-10-CM | POA: Insufficient documentation

## 2018-02-13 ENCOUNTER — Ambulatory Visit (INDEPENDENT_AMBULATORY_CARE_PROVIDER_SITE_OTHER): Payer: PPO

## 2018-02-13 ENCOUNTER — Encounter: Payer: Self-pay | Admitting: Sports Medicine

## 2018-02-13 ENCOUNTER — Ambulatory Visit (INDEPENDENT_AMBULATORY_CARE_PROVIDER_SITE_OTHER): Payer: PPO | Admitting: Sports Medicine

## 2018-02-13 ENCOUNTER — Ambulatory Visit: Payer: Self-pay

## 2018-02-13 VITALS — BP 122/76 | HR 72 | Ht 62.0 in | Wt 113.2 lb

## 2018-02-13 DIAGNOSIS — M25561 Pain in right knee: Secondary | ICD-10-CM

## 2018-02-13 DIAGNOSIS — G8929 Other chronic pain: Secondary | ICD-10-CM | POA: Diagnosis not present

## 2018-02-13 DIAGNOSIS — M25461 Effusion, right knee: Secondary | ICD-10-CM | POA: Diagnosis not present

## 2018-02-13 DIAGNOSIS — M1711 Unilateral primary osteoarthritis, right knee: Secondary | ICD-10-CM

## 2018-02-13 NOTE — Patient Instructions (Addendum)
You had an injection today.  Things to be aware of after injection are listed below: . You may experience no significant improvement or even a slight worsening in your symptoms during the first 24 to 48 hours.  After that we expect your symptoms to improve gradually over the next 2 weeks for the medicine to have its maximal effect.  You should continue to have improvement out to 6 weeks after your injection. . Dr. Paulla Fore recommends icing the site of the injection for 20 minutes  1-2 times the day of your injection . You may shower but no swimming, tub bath or Jacuzzi for 24 hours. . If your bandage falls off this does not need to be replaced.  It is appropriate to remove the bandage after 4 hours. . You may resume light activities as tolerated unless otherwise directed per Dr. Paulla Fore during your visit  POSSIBLE STEROID SIDE EFFECTS:  Side effects from injectable steroids tend to be less than when taken orally however you may experience some of the symptoms listed below.  If experienced these should only last for a short period of time. Change in menstrual flow  Edema (swelling)  Increased appetite Skin flushing (redness)  Skin rash/acne  Thrush (oral) Yeast vaginitis    Increased sweating  Depression Increased blood glucose levels Cramping and leg/calf  Euphoria (feeling happy)  POSSIBLE PROCEDURE SIDE EFFECTS: The side effects of the injection are usually fairly minimal however if you may experience some of the following side effects that are usually self-limited and will is off on their own.  If you are concerned please feel free to call the office with questions:  Increased numbness or tingling  Nausea or vomiting  Swelling or bruising at the injection site   Please call our office if if you experience any of the following symptoms over the next week as these can be signs of infection:   Fever greater than 100.27F  Significant swelling at the injection site  Significant redness or drainage  from the injection site  If after 2 weeks you are continuing to have worsening symptoms please call our office to discuss what the next appropriate actions should be including the potential for a return office visit or other diagnostic testing.   Please perform the exercise program that we have prepared for you and gone over in detail on a daily basis.  In addition to the handout you were provided you can access your program through: [email or web address]   Your unique program code is:  [email or web address]   I recommend you obtained a compression sleeve to help with your joint problems. There are many options on the market however I recommend obtaining a knee Body Helix compression sleeve.  You can find information (including how to appropriate measure yourself for sizing) can be found at www.Body http://www.lambert.com/.  Many of these products are health savings account (HSA) eligible.   You can use the compression sleeve at any time throughout the day but is most important to use while being active as well as for 2 hours post-activity.   It is appropriate to ice following activity with the compression sleeve in place.

## 2018-02-13 NOTE — Procedures (Signed)
PROCEDURE NOTE:  Ultrasound Guided: Injection: Right knee Images were obtained and interpreted by myself, Teresa Coombs, DO  Images have been saved and stored to PACS system. Images obtained on: GE S7 Ultrasound machine    ULTRASOUND FINDINGS:  Small effusion with moderate synovitis.  DESCRIPTION OF PROCEDURE:  The patient's clinical condition is marked by substantial pain and/or significant functional disability. Other conservative therapy has not provided relief, is contraindicated, or not appropriate. There is a reasonable likelihood that injection will significantly improve the patient's pain and/or functional impairment.   After discussing the risks, benefits and expected outcomes of the injection and all questions were reviewed and answered, the patient wished to undergo the above named procedure.  Verbal consent was obtained.  The ultrasound was used to identify the target structure and adjacent neurovascular structures. The skin was then prepped in sterile fashion and the target structure was injected under direct visualization using sterile technique as below:  Single injection performed as below: PREP: Alcohol and Ethel Chloride APPROACH:superiolateral, single injection, 25g 1.5 in. INJECTATE: 2 cc 0.5% Marcaine and 2 cc 40mg /mL DepoMedrol ASPIRATE: None DRESSING: Band-Aid  Post procedural instructions including recommending icing and warning signs for infection were reviewed.    This procedure was well tolerated and there were no complications.   IMPRESSION: Succesful Ultrasound Guided: Injection

## 2018-02-13 NOTE — Progress Notes (Signed)
Taylor Mclaughlin. Taylor Mclaughlin, [facility]  Taylor Mclaughlin - 73 y.o. female MRN [id number]  Date of birth: 1945-06-23  Visit Date: February 13, 2018  PCP: Midge Minium, MD   Referred by: Midge Minium, MD  SUBJECTIVE:  Chief Complaint  Patient presents with  . Right Knee - Initial Assessment  . Initial Assessment    Referred by Dr. Annye Asa.     HPI: Patient is here for evaluation of right knee pain.  This is been present for several months up to a year.  She has clicking popping and catching.  Occasionally feels like a metal rod is jabbing her especially the medial aspect.  She has mild swelling.  Occasionally catches and gives way.  The swelling is only been present for the past couple of months.  This is causing some pain radiating into her thighs/low back but this is mild.  Pillow between her legs at night is somewhat beneficial.  She is taking glucosamine and conjoint as well as Mobic and Tumeric with some improvement of the symptoms but incomplete.  REVIEW OF SYSTEMS: Nighttime disturbances when she bumps her knee along the contralateral knee  Some mild lower extremity swelling of the leg but this is minimal. Otherwise 12 point review of systems negative  HISTORY:  Prior history reviewed and updated per electronic medical record.  Patient Active Problem List   Diagnosis Date Noted  . Right knee pain 02/08/2018    04/30/2015 XR R knee IMPRESSION: Moderate osteoarthritic change centered on the medial joint compartment. There is mild involvement of the patellofemoral joint.   . Physical exam 02/01/2017  . Anemia 02/01/2017  . Hearing loss 02/01/2017  . Osteoporosis 04/14/2016  . Trochanteric bursitis of left hip 04/14/2016  . Hx of colonic polyp 02/26/2016  . Arthritis 02/26/2016  . Cystocele 12/30/2013   Social History   Occupational History  . Not on file  Tobacco Use  . Smoking status:  Never Smoker  . Smokeless tobacco: Never Used  Substance and Sexual Activity  . Alcohol use: Yes    Comment: OCCASIONAL  . Drug use: No  . Sexual activity: Not on file   Social History   Social History Narrative  . Not on file   Past Medical History:  Diagnosis Date  . Arthritis   . Prolapse of female pelvic organs   . Urgency of urination    Past Surgical History:  Procedure Laterality Date  . ABDOMINAL HYSTERECTOMY    . CYSTO N/A 12/30/2013   Procedure: CYSTO;  Surgeon: Ardis Hughs, MD;  Location: [facility];  Service: Urology;  Laterality: N/A;  . HAND SURGERY  2000   L THUMB  . ROBOTIC ASSISTED LAPAROSCOPIC HYSTERECTOMY AND SALPINGECTOMY N/A 12/30/2013   Procedure: ROBOTIC ASSISTED LAPAROSCOPIC HYSTERECTOMY AND SACROCOLPOPEXY;  Surgeon: Ardis Hughs, MD;  Location: [facility];  Service: Urology;  Laterality: N/A;  . TONSILLECTOMY  1952  . TUBAL LIGATION  1975  . WISDOM TOOTH EXTRACTION  1993   family history includes Depression in her daughter and son; Hyperlipidemia in her mother; Macular degeneration in her brother; Parkinson's disease in her father.  OBJECTIVE:  VS:  HT:5\' 2"  (157.5 cm)   WT:113 lb 3.2 oz (51.3 kg)  BMI:20.7    BP:122/76  HR:72bpm  TEMP: ( )  RESP:97 %   PHYSICAL EXAM: CONSTITUTIONAL: Well-developed, Well-nourished and In no acute distress EYES: Pupils are equal., EOM intact  without nystagmus. and No scleral icterus. Psychiatric: Alert & appropriately interactive. and Not depressed or anxious appearing. EXTREMITY EXAM: Warm and well perfused  Right knee overall well aligned.  Moderate effusion with marked synovitis.  Pain along the medial and lateral joint line but worse on the medial.  Pain with McMurray's.  Slightly positive patellar grind test.  Extensor mechanism intact.  Ligamentously stable to anterior, posterior drawer and varus valgus strain.  Knee x-ray reviewed does show moderate degenerative change worse in the medial  compartment  ASSESSMENT:  1. Chronic pain of right knee   2. Primary osteoarthritis of right knee     PROCEDURES:  US Guided Injection per procedure note      PLAN:  Pertinent additional documentation may be included in corresponding procedure notes, imaging studies, problem based documentation and patient instructions.  We will go ahead and inject the knee per procedure note and have them begin on hip and knee strenghtening exersises. Additionally we discussed the merits of compression and/or bracing and recommend prophylatic compression with activity.  Icing discussed PRN.  If persistent ongoing symptoms can consider repeat injections and viscous supplementation.  Home Therapeutic exercises prescribed today per procedure note.  Activity modifications and the importance of avoiding exacerbating activities (limiting pain to no more than a 4 / 10 during or following activity) recommended and discussed.  Discussed red flag symptoms that warrant earlier emergent evaluation and patient voices understanding.   No orders of the defined types were placed in this encounter.  Lab Orders  No laboratory test(s) ordered today    Imaging Orders     DG Knee AP/LAT W/Sunrise Right     [place] MSK POCT ULTRASOUND Referral Orders  No referral(s) requested today    Return in about 6 weeks (around 03/27/2018).          Gerda Diss, [facility] Physician

## 2018-03-27 ENCOUNTER — Ambulatory Visit: Payer: PPO | Admitting: Sports Medicine

## 2018-04-11 ENCOUNTER — Other Ambulatory Visit: Payer: PPO

## 2018-04-11 ENCOUNTER — Ambulatory Visit: Payer: PPO

## 2018-04-18 ENCOUNTER — Other Ambulatory Visit: Payer: Self-pay | Admitting: Family Medicine

## 2018-04-18 MED ORDER — MELOXICAM 15 MG PO TABS: 15.0000 mg | ORAL_TABLET | Freq: Every day | ORAL | 0 refills | Status: DC

## 2018-04-30 ENCOUNTER — Telehealth: Payer: Self-pay | Admitting: Physical Therapy

## 2018-04-30 NOTE — Telephone Encounter (Signed)
Called and spoke to pt regarding her R knee.  She states that she didn't get much relief from the injection she had on 02/13/18 but does feel that her knee compression sleeve is helping.  She states that she wants to wait until fall 2020 to come into the office.  I inform her that we are doing virtual visits as well as in-office visits.  She states that she'll call and schedule when she's ready.

## 2018-07-14 ENCOUNTER — Other Ambulatory Visit: Payer: Self-pay | Admitting: Family Medicine

## 2018-10-03 ENCOUNTER — Ambulatory Visit: Payer: PPO

## 2018-10-03 ENCOUNTER — Other Ambulatory Visit: Payer: PPO

## 2018-10-10 ENCOUNTER — Other Ambulatory Visit: Payer: Self-pay | Admitting: Family Medicine

## 2018-10-10 ENCOUNTER — Other Ambulatory Visit: Payer: Self-pay

## 2018-10-10 ENCOUNTER — Ambulatory Visit (INDEPENDENT_AMBULATORY_CARE_PROVIDER_SITE_OTHER): Payer: PPO

## 2018-10-10 DIAGNOSIS — Z23 Encounter for immunization: Secondary | ICD-10-CM | POA: Diagnosis not present

## 2019-02-01 ENCOUNTER — Ambulatory Visit: Payer: PPO

## 2019-02-09 ENCOUNTER — Ambulatory Visit: Payer: PPO | Attending: Internal Medicine

## 2019-02-09 DIAGNOSIS — Z23 Encounter for immunization: Secondary | ICD-10-CM

## 2019-02-09 NOTE — Progress Notes (Signed)
   Covid-19 Vaccination Clinic  Name:  Taylor Mclaughlin    MRN: [id number] DOB: 02-03-1945  02/09/2019  [name] was observed post Covid-19 immunization for 15 minutes without incidence. She was provided with Vaccine Information Sheet and instruction to access the V-Safe system.   [name] was instructed to call 911 with any severe reactions post vaccine: Marland Kitchen Difficulty breathing  . Swelling of your face and throat  . A fast heartbeat  . A bad rash all over your body  . Dizziness and weakness    Immunizations Administered    Name Date Dose VIS Date Route   Pfizer COVID-19 Vaccine 02/09/2019  3:16 PM 0.3 mL 12/14/2018 Intramuscular   Manufacturer: [place]   Lot: [id number]   [place]: [id number]

## 2019-02-12 ENCOUNTER — Ambulatory Visit: Payer: PPO

## 2019-02-27 DIAGNOSIS — L821 Other seborrheic keratosis: Secondary | ICD-10-CM | POA: Diagnosis not present

## 2019-02-27 DIAGNOSIS — D1801 Hemangioma of skin and subcutaneous tissue: Secondary | ICD-10-CM | POA: Diagnosis not present

## 2019-02-27 DIAGNOSIS — D485 Neoplasm of uncertain behavior of skin: Secondary | ICD-10-CM | POA: Diagnosis not present

## 2019-02-27 DIAGNOSIS — D225 Melanocytic nevi of trunk: Secondary | ICD-10-CM | POA: Diagnosis not present

## 2019-02-27 DIAGNOSIS — Z85828 Personal history of other malignant neoplasm of skin: Secondary | ICD-10-CM | POA: Diagnosis not present

## 2019-02-27 DIAGNOSIS — L814 Other melanin hyperpigmentation: Secondary | ICD-10-CM | POA: Diagnosis not present

## 2019-03-06 ENCOUNTER — Ambulatory Visit: Payer: PPO | Attending: Internal Medicine

## 2019-03-06 DIAGNOSIS — Z23 Encounter for immunization: Secondary | ICD-10-CM

## 2019-03-06 NOTE — Progress Notes (Signed)
   Covid-19 Vaccination Clinic  Name:  Taylor Mclaughlin    MRN: [id number] DOB: [birth year]  03/06/2019  Taylor Mclaughlin was observed post Covid-19 immunization for 15 minutes without incident. She was provided with Vaccine Information Sheet and instruction to access the V-Safe system.   Taylor Mclaughlin was instructed to call 911 with any severe reactions post vaccine: Marland Kitchen Difficulty breathing  . Swelling of face and throat  . A fast heartbeat  . A bad rash all over body  . Dizziness and weakness   Immunizations Administered    Name Date Dose VIS Date Route   Pfizer COVID-19 Vaccine 03/06/2019  1:22 PM 0.3 mL 12/14/2018 Intramuscular   Manufacturer: [place]   Lot: [id number]   [place]: [id number]

## 2019-04-10 DIAGNOSIS — H35371 Puckering of macula, right eye: Secondary | ICD-10-CM | POA: Diagnosis not present

## 2019-04-10 DIAGNOSIS — H2513 Age-related nuclear cataract, bilateral: Secondary | ICD-10-CM | POA: Diagnosis not present

## 2019-10-09 ENCOUNTER — Other Ambulatory Visit: Payer: Self-pay

## 2019-10-09 ENCOUNTER — Ambulatory Visit (INDEPENDENT_AMBULATORY_CARE_PROVIDER_SITE_OTHER): Payer: PPO

## 2019-10-09 DIAGNOSIS — Z23 Encounter for immunization: Secondary | ICD-10-CM | POA: Diagnosis not present

## 2020-04-13 DIAGNOSIS — H2513 Age-related nuclear cataract, bilateral: Secondary | ICD-10-CM | POA: Diagnosis not present

## 2020-05-15 DIAGNOSIS — M25561 Pain in right knee: Secondary | ICD-10-CM | POA: Diagnosis not present

## 2020-05-15 DIAGNOSIS — M25461 Effusion, right knee: Secondary | ICD-10-CM | POA: Diagnosis not present

## 2020-05-15 DIAGNOSIS — M1711 Unilateral primary osteoarthritis, right knee: Secondary | ICD-10-CM | POA: Diagnosis not present

## 2020-06-26 DIAGNOSIS — M1711 Unilateral primary osteoarthritis, right knee: Secondary | ICD-10-CM | POA: Diagnosis not present

## 2020-06-26 DIAGNOSIS — M25461 Effusion, right knee: Secondary | ICD-10-CM | POA: Diagnosis not present

## 2020-06-26 DIAGNOSIS — M25561 Pain in right knee: Secondary | ICD-10-CM | POA: Diagnosis not present

## 2020-07-01 ENCOUNTER — Encounter: Payer: Self-pay | Admitting: *Deleted

## 2020-07-03 DIAGNOSIS — M1711 Unilateral primary osteoarthritis, right knee: Secondary | ICD-10-CM | POA: Diagnosis not present

## 2020-07-03 DIAGNOSIS — M25561 Pain in right knee: Secondary | ICD-10-CM | POA: Diagnosis not present

## 2020-07-03 DIAGNOSIS — M25461 Effusion, right knee: Secondary | ICD-10-CM | POA: Diagnosis not present

## 2020-07-10 DIAGNOSIS — M25461 Effusion, right knee: Secondary | ICD-10-CM | POA: Diagnosis not present

## 2020-07-10 DIAGNOSIS — M25561 Pain in right knee: Secondary | ICD-10-CM | POA: Diagnosis not present

## 2020-07-10 DIAGNOSIS — M1711 Unilateral primary osteoarthritis, right knee: Secondary | ICD-10-CM | POA: Diagnosis not present

## 2020-07-17 DIAGNOSIS — M25561 Pain in right knee: Secondary | ICD-10-CM | POA: Diagnosis not present

## 2020-07-17 DIAGNOSIS — M1711 Unilateral primary osteoarthritis, right knee: Secondary | ICD-10-CM | POA: Diagnosis not present

## 2020-07-17 DIAGNOSIS — M25461 Effusion, right knee: Secondary | ICD-10-CM | POA: Diagnosis not present

## 2020-08-25 DIAGNOSIS — M25562 Pain in left knee: Secondary | ICD-10-CM | POA: Diagnosis not present

## 2020-08-25 DIAGNOSIS — M1711 Unilateral primary osteoarthritis, right knee: Secondary | ICD-10-CM | POA: Diagnosis not present

## 2020-08-25 DIAGNOSIS — M9906 Segmental and somatic dysfunction of lower extremity: Secondary | ICD-10-CM | POA: Diagnosis not present

## 2020-09-03 ENCOUNTER — Telehealth (INDEPENDENT_AMBULATORY_CARE_PROVIDER_SITE_OTHER): Payer: PPO | Admitting: Family Medicine

## 2020-09-03 ENCOUNTER — Encounter: Payer: Self-pay | Admitting: Family Medicine

## 2020-09-03 VITALS — Temp 97.6°F | Ht 62.0 in | Wt 113.0 lb

## 2020-09-03 DIAGNOSIS — R197 Diarrhea, unspecified: Secondary | ICD-10-CM

## 2020-09-03 NOTE — Patient Instructions (Addendum)
You can try some Imodium for the symptoms per instructions.  Please eat a bland diet and avoid dairy.  Drink plenty of fluids to stay hydrated.  I hope you are feeling better soon!  Seek in person care promptly if your symptoms worsen, new concerns arise or you are not improving with treatment over the next 1 to 2 days.  It was nice to meet you today. I help [place] out with telemedicine visits on Tuesdays and Thursdays and am available for visits on those days. If you have any concerns or questions following this visit please schedule a follow up visit with your Primary Care doctor or seek care at a local urgent care clinic to avoid delays in care.

## 2020-09-03 NOTE — Progress Notes (Signed)
Virtual Visit via Video Note  I connected with Taylor Mclaughlin  on 09/03/20 at 11:00 AM EDT by a video enabled telemedicine application and verified that I am speaking with the correct person using two identifiers.  Location patient: home, [place] Location provider:work or home office Persons participating in the virtual visit: patient, provider, my husband  I discussed the limitations of evaluation and management by telemedicine and the availability of in person appointments. The patient expressed understanding and agreed to proceed.   HPI:  Acute telemedicine visit for diarrhea: -Onset: about 1 week -Symptoms include: intermittent diarrhea, some nausea, was watery initially, has had a few episodes today of the watery diarrhea -Denies: fever, sore throat, abd pain, cough, body aches, melena, hematochezia, recent travel or antibiotics, known sick contacts -Has tried: bismuth -Pertinent past medical history:see below -Pertinent medication allergies: No Known Allergies -COVID-19 vaccine status: vaccinated x 2 and one booster  ROS: See pertinent positives and negatives per HPI.  Past Medical History:  Diagnosis Date   Arthritis    Prolapse of female pelvic organs    Urgency of urination     Past Surgical History:  Procedure Laterality Date   ABDOMINAL HYSTERECTOMY     CYSTO N/A 12/30/2013   Procedure: CYSTO;  Surgeon: Ardis Hughs, MD;  Location: [facility];  Service: Urology;  Laterality: N/A;   HAND SURGERY  2000   L THUMB   ROBOTIC ASSISTED LAPAROSCOPIC HYSTERECTOMY AND SALPINGECTOMY N/A 12/30/2013   Procedure: ROBOTIC ASSISTED LAPAROSCOPIC HYSTERECTOMY AND SACROCOLPOPEXY;  Surgeon: Ardis Hughs, MD;  Location: [facility];  Service: Urology;  Laterality: N/A;   TONSILLECTOMY  1952   TUBAL LIGATION  1975   WISDOM TOOTH EXTRACTION  1993     Current Outpatient Medications:    Calcium Carb-Cholecalciferol 600-800 MG-UNIT TABS, Take by mouth., Disp: , Rfl:    Calcium Carbonate-Vitamin  D 500-125 MG-UNIT TABS, Take by mouth., Disp: , Rfl:    GLUCOSAMINE CHONDROITIN COMPLX PO, Take 1 tablet by mouth daily., Disp: , Rfl:    MAGNESIUM PO, Take by mouth daily., Disp: , Rfl:    Multiple Vitamins-Minerals (MULTIVITAMIN ADULT PO), Take 500 mg by mouth daily., Disp: , Rfl:    TURMERIC PO, Take 500 mg by mouth daily. , Disp: , Rfl:   EXAM:  VITALS per patient if applicable:  GENERAL: alert, oriented, appears well and in no acute distress  HEENT: atraumatic, conjunttiva clear, no obvious abnormalities on inspection of external nose and ears  NECK: normal movements of the head and neck  LUNGS: on inspection no signs of respiratory distress, breathing rate appears normal, no obvious gross SOB, gasping or wheezing  CV: no obvious cyanosis  MS: moves all visible extremities without noticeable abnormality  PSYCH/NEURO: pleasant and cooperative, no obvious depression or anxiety, speech and thought processing grossly intact  ASSESSMENT AND PLAN:  Discussed the following assessment and plan:  Diarrhea, unspecified type  -we discussed possible serious and likely etiologies, options for evaluation and workup, limitations of telemedicine visit vs in person visit, treatment, treatment risks and precautions. Pt is agreeable to treatment via telemedicine at this moment. Seeing lots of diarrhea in clinic recently. There is likely a stomach bug going around. Query gastroenteritis, food poisoning, covid19 vs other. She opted to do covid testing, try dairy free diet, oral hydration and imodium and seek inperson evaluation if worsening, new symptoms arise, or if is not improving with treatment. Discussed options for inperson care if PCP office not available. Did let this  patient know that I only do telemedicine on Tuesdays and Thursdays for [place]. Advised to schedule follow up visit with PCP or [facility] if any further questions or concerns to avoid delays in care.   I discussed the assessment and  treatment plan with the patient. The patient was provided an opportunity to ask questions and all were answered. The patient agreed with the plan and demonstrated an understanding of the instructions.     Lucretia Kern, DO

## 2020-09-09 ENCOUNTER — Encounter: Payer: Self-pay | Admitting: Registered Nurse

## 2020-09-09 ENCOUNTER — Ambulatory Visit (INDEPENDENT_AMBULATORY_CARE_PROVIDER_SITE_OTHER): Payer: PPO | Admitting: Registered Nurse

## 2020-09-09 ENCOUNTER — Other Ambulatory Visit: Payer: Self-pay

## 2020-09-09 VITALS — BP 120/82 | HR 65 | Temp 98.0°F | Resp 17 | Wt 110.2 lb

## 2020-09-09 DIAGNOSIS — Z1329 Encounter for screening for other suspected endocrine disorder: Secondary | ICD-10-CM

## 2020-09-09 DIAGNOSIS — R195 Other fecal abnormalities: Secondary | ICD-10-CM | POA: Diagnosis not present

## 2020-09-09 DIAGNOSIS — Z130 Encounter for screening for diseases of the blood and blood-forming organs and certain disorders involving the immune mechanism: Secondary | ICD-10-CM

## 2020-09-09 DIAGNOSIS — Z13228 Encounter for screening for other metabolic disorders: Secondary | ICD-10-CM | POA: Diagnosis not present

## 2020-09-09 DIAGNOSIS — Z8639 Personal history of other endocrine, nutritional and metabolic disease: Secondary | ICD-10-CM | POA: Diagnosis not present

## 2020-09-09 DIAGNOSIS — Z13220 Encounter for screening for lipoid disorders: Secondary | ICD-10-CM | POA: Diagnosis not present

## 2020-09-09 LAB — CBC WITH DIFFERENTIAL/PLATELET
Basophils Absolute: 0 10*3/uL (ref 0.0–0.1)
Basophils Relative: 0.5 % (ref 0.0–3.0)
Eosinophils Absolute: 0 10*3/uL (ref 0.0–0.7)
Eosinophils Relative: 0.7 % (ref 0.0–5.0)
HCT: 36.6 % (ref 36.0–46.0)
Hemoglobin: 12.1 g/dL (ref 12.0–15.0)
Lymphocytes Relative: 24.9 % (ref 12.0–46.0)
Lymphs Abs: 1.3 10*3/uL (ref 0.7–4.0)
MCHC: 33 g/dL (ref 30.0–36.0)
MCV: 92 fl (ref 78.0–100.0)
Monocytes Absolute: 0.6 10*3/uL (ref 0.1–1.0)
Monocytes Relative: 10.4 % (ref 3.0–12.0)
Neutro Abs: 3.4 10*3/uL (ref 1.4–7.7)
Neutrophils Relative %: 63.5 % (ref 43.0–77.0)
Platelets: 323 10*3/uL (ref 150.0–400.0)
RBC: 3.98 Mil/uL (ref 3.87–5.11)
RDW: 12.8 % (ref 11.5–15.5)
WBC: 5.4 10*3/uL (ref 4.0–10.5)

## 2020-09-09 LAB — COMPREHENSIVE METABOLIC PANEL
ALT: 13 U/L (ref 0–35)
AST: 21 U/L (ref 0–37)
Albumin: 4.3 g/dL (ref 3.5–5.2)
Alkaline Phosphatase: 67 U/L (ref 39–117)
BUN: 10 mg/dL (ref 6–23)
CO2: 26 mEq/L (ref 19–32)
Calcium: 9.6 mg/dL (ref 8.4–10.5)
Chloride: 102 mEq/L (ref 96–112)
Creatinine, Ser: 0.57 mg/dL (ref 0.40–1.20)
GFR: 89.07 mL/min (ref >60.00)
Glucose, Bld: 68 mg/dL — ABNORMAL LOW (ref 70–99)
Potassium: 4.1 mEq/L (ref 3.5–5.1)
Sodium: 139 mEq/L (ref 135–145)
Total Bilirubin: 1 mg/dL (ref 0.2–1.2)
Total Protein: 7 g/dL (ref 6.0–8.3)

## 2020-09-09 LAB — MAGNESIUM: Magnesium: 2.2 mg/dL (ref 1.5–2.5)

## 2020-09-09 LAB — LIPID PANEL
Cholesterol: 184 mg/dL (ref 0–200)
HDL: 58.9 mg/dL (ref >39.00)
LDL Cholesterol: 103 mg/dL — ABNORMAL HIGH (ref 0–99)
NonHDL: 124.77
Total CHOL/HDL Ratio: 3
Triglycerides: 111 mg/dL (ref 0.0–149.0)
VLDL: 22.2 mg/dL (ref 0.0–40.0)

## 2020-09-09 LAB — HEMOGLOBIN A1C: Hgb A1c MFr Bld: 6.1 % (ref 4.6–6.5)

## 2020-09-09 LAB — TSH: TSH: 0.77 u[IU]/mL (ref 0.35–5.50)

## 2020-09-09 NOTE — Progress Notes (Signed)
Established Patient Office Visit  Subjective:  Patient ID: Taylor Mclaughlin, female    DOB: 08/18/1945  Age: 75 y.o. MRN: [id number]  CC:  Chief Complaint  Patient presents with   Diarrhea    HPI Taylor Mclaughlin presents for diarrhea  Started around 2 weeks ago with diarrhea and flatulence Mostly in mornings - every 108m1h Initially felt like some mucus in the stool. This past Thursday was entirely watery Had started taking magnesium around 1 mo ago VV with Dr. KMaudie Mercurywho suggested imodium on Thursday -  Effective, but ended up without bm for 2 days on Fri-Sat-Sun Still feels uncomfortable - having some trouble with limiting food Some feeling of tenesmus in past few days Improving though, normal BM this morning  Past Medical History:  Diagnosis Date   Arthritis    Prolapse of female pelvic organs    Urgency of urination     Past Surgical History:  Procedure Laterality Date   ABDOMINAL HYSTERECTOMY     CYSTO N/A 12/30/2013   Procedure: CYSTO;  Surgeon: BArdis Hughs MD;  Location: [facility];  Service: Urology;  Laterality: N/A;   HAND SURGERY  2000   L THUMB   ROBOTIC ASSISTED LAPAROSCOPIC HYSTERECTOMY AND SALPINGECTOMY N/A 12/30/2013   Procedure: ROBOTIC ASSISTED LAPAROSCOPIC HYSTERECTOMY AND SACROCOLPOPEXY;  Surgeon: BArdis Hughs MD;  Location: [facility];  Service: Urology;  Laterality: N/A;   TONSILLECTOMY  1952   TUBAL LIGATION  1975   WISDOM TOOTH EXTRACTION  1993    Family History  Problem Relation Age of Onset   Hyperlipidemia Mother    Parkinson's disease Father    Macular degeneration Brother    Depression Daughter    Depression Son     Social History   Socioeconomic History   Marital status: Married    Spouse name: Not on file   Number of children: Not on file   Years of education: Not on file   Highest education level: Not on file  Occupational History   Not on file  Tobacco Use   Smoking status: Never   Smokeless tobacco: Never   Substance and Sexual Activity   Alcohol use: Yes    Comment: OCCASIONAL   Drug use: No   Sexual activity: Not on file  Other Topics Concern   Not on file  Social History Narrative   Not on file   Social Determinants of Health   Financial Resource Strain: Not on file  Food Insecurity: Not on file  Transportation Needs: Not on file  Physical Activity: Not on file  Stress: Not on file  Social Connections: Not on file  Intimate Partner Violence: Not on file    Outpatient Medications Prior to Visit  Medication Sig Dispense Refill   Calcium Carbonate-Vitamin D 500-125 MG-UNIT TABS Take by mouth.     GLUCOSAMINE CHONDROITIN COMPLX PO Take 1 tablet by mouth daily.     Multiple Vitamins-Minerals (MULTIVITAMIN ADULT PO) Take 500 mg by mouth daily.     TURMERIC PO Take 500 mg by mouth daily.      Calcium Carb-Cholecalciferol 600-800 MG-UNIT TABS Take by mouth. (Patient not taking: Reported on 09/09/2020)     MAGNESIUM PO Take by mouth daily. (Patient not taking: Reported on 09/09/2020)     No facility-administered medications prior to visit.    No Known Allergies  ROS Review of Systems Per hpi    Objective:    Physical Exam Vitals and nursing note reviewed.  Constitutional:  General: She is not in acute distress.    Appearance: Normal appearance. She is normal weight. She is not ill-appearing, toxic-appearing or diaphoretic.  Cardiovascular:     Rate and Rhythm: Normal rate and regular rhythm.     Heart sounds: Normal heart sounds. No murmur heard.   No friction rub. No gallop.  Pulmonary:     Effort: Pulmonary effort is normal. No respiratory distress.     Breath sounds: Normal breath sounds. No stridor. No wheezing, rhonchi or rales.  Chest:     Chest wall: No tenderness.  Abdominal:     General: Abdomen is flat. Bowel sounds are normal. There is no distension.     Tenderness: There is no abdominal tenderness.  Skin:    General: Skin is warm and dry.   Neurological:     General: No focal deficit present.     Mental Status: She is alert and oriented to person, place, and time. Mental status is at baseline.  Psychiatric:        Mood and Affect: Mood normal.        Behavior: Behavior normal.        Thought Content: Thought content normal.        Judgment: Judgment normal.    BP 120/82   Pulse 65   Temp 98 F (36.7 C) (Temporal)   Resp 17   Wt 110 lb 3.2 oz (50 kg)   SpO2 98%   BMI 20.16 kg/m  Wt Readings from Last 3 Encounters:  09/09/20 110 lb 3.2 oz (50 kg)  09/03/20 113 lb (51.3 kg)  02/13/18 113 lb 3.2 oz (51.3 kg)     Health Maintenance Due  Topic Date Due   Zoster Vaccines- Shingrix (2 of 2) 07/11/2017   INFLUENZA VACCINE  08/03/2020    There are no preventive care reminders to display for this patient.  Lab Results  Component Value Date   TSH 0.82 02/07/2018   Lab Results  Component Value Date   WBC 5.6 02/07/2018   HGB 11.9 (L) 02/07/2018   HCT 35.2 (L) 02/07/2018   MCV 92.3 02/07/2018   PLT 343.0 02/07/2018   Lab Results  Component Value Date   NA 139 02/07/2018   K 4.0 02/07/2018   CO2 28 02/07/2018   GLUCOSE 81 02/07/2018   BUN 20 02/07/2018   CREATININE 0.64 02/07/2018   BILITOT 0.9 02/07/2018   ALKPHOS 85 02/07/2018   AST 24 02/07/2018   ALT 14 02/07/2018   PROT 7.0 02/07/2018   ALBUMIN 4.4 02/07/2018   CALCIUM 9.6 02/07/2018   ANIONGAP 11 12/23/2013   GFR 91.07 02/07/2018   Lab Results  Component Value Date   CHOL 188 02/07/2018   Lab Results  Component Value Date   HDL 60.10 02/07/2018   Lab Results  Component Value Date   LDLCALC 110 (H) 02/07/2018   Lab Results  Component Value Date   TRIG 90.0 02/07/2018   Lab Results  Component Value Date   CHOLHDL 3 02/07/2018   No results found for: HGBA1C    Assessment & Plan:   Problem List Items Addressed This Visit   None Visit Diagnoses     Watery stools    -  Primary   Relevant Orders   Comprehensive metabolic  panel   Hemoglobin A1c   CBC with Differential/Platelet   TSH   Lipid panel   Magnesium   History of elevated glucose       Relevant Orders  Hemoglobin A1c   Screening for endocrine, metabolic and immunity disorder       Relevant Orders   Comprehensive metabolic panel   CBC with Differential/Platelet   TSH   Lipid screening       Relevant Orders   Lipid panel       No orders of the defined types were placed in this encounter.   Follow-up: Return if symptoms worsen or fail to improve.   PLAN Sounds like related to magnesium supplement. Improved since stopping this and two days of imodium. Reintroduce regular diet and activity, continue adequate rehydration with clear fluids. Labs to rule out insidious Gi bleed and lyte imbalances. Will follow up as warranted Routine screen of lipids, thyroid, and A1c while she is here. Again will follow up as warranted. Patient encouraged to call clinic with any questions, comments, or concerns.  Maximiano Coss, NP

## 2020-09-09 NOTE — Patient Instructions (Addendum)
[name] to meet you  Glad things are getting better. I think this will continue  No more magnesium for now. Ok to use intermittently if having nighttime cramping in lower legs.  Ok to use imodium once daily as needed for loose stools, but I think need for this will continue to diminish  Start back towards routine diet and activity. Continue to rehydrate  Labs will be back today or tomorrow. I'll call with any urgent concerns  Thank you  Rich

## 2020-09-11 ENCOUNTER — Ambulatory Visit
Admission: RE | Admit: 2020-09-11 | Discharge: 2020-09-11 | Disposition: A | Discharge status: Home / Self Care | Payer: PPO | Source: Ambulatory Visit | Attending: Sports Medicine | Admitting: Sports Medicine

## 2020-09-11 ENCOUNTER — Other Ambulatory Visit: Payer: Self-pay | Admitting: Sports Medicine

## 2020-09-11 DIAGNOSIS — M170 Bilateral primary osteoarthritis of knee: Secondary | ICD-10-CM

## 2020-09-11 DIAGNOSIS — M25462 Effusion, left knee: Secondary | ICD-10-CM | POA: Diagnosis not present

## 2020-09-11 DIAGNOSIS — M25461 Effusion, right knee: Secondary | ICD-10-CM | POA: Diagnosis not present

## 2020-09-27 ENCOUNTER — Encounter (HOSPITAL_COMMUNITY): Payer: Self-pay | Admitting: *Deleted

## 2020-09-27 ENCOUNTER — Other Ambulatory Visit: Payer: Self-pay

## 2020-09-27 ENCOUNTER — Emergency Department (HOSPITAL_COMMUNITY)
Admission: EM | Admit: 2020-09-27 | Discharge: 2020-09-28 | Disposition: A | Discharge status: Home / Self Care | Payer: PPO | Attending: Emergency Medicine | Admitting: Emergency Medicine

## 2020-09-27 DIAGNOSIS — R1032 Left lower quadrant pain: Secondary | ICD-10-CM | POA: Diagnosis present

## 2020-09-27 DIAGNOSIS — I10 Essential (primary) hypertension: Secondary | ICD-10-CM | POA: Diagnosis not present

## 2020-09-27 DIAGNOSIS — R1110 Vomiting, unspecified: Secondary | ICD-10-CM | POA: Diagnosis not present

## 2020-09-27 DIAGNOSIS — K760 Fatty (change of) liver, not elsewhere classified: Secondary | ICD-10-CM | POA: Diagnosis not present

## 2020-09-27 DIAGNOSIS — R1084 Generalized abdominal pain: Secondary | ICD-10-CM | POA: Diagnosis not present

## 2020-09-27 DIAGNOSIS — N200 Calculus of kidney: Secondary | ICD-10-CM | POA: Insufficient documentation

## 2020-09-27 DIAGNOSIS — R197 Diarrhea, unspecified: Secondary | ICD-10-CM | POA: Diagnosis not present

## 2020-09-27 DIAGNOSIS — R109 Unspecified abdominal pain: Secondary | ICD-10-CM | POA: Diagnosis not present

## 2020-09-27 DIAGNOSIS — R1030 Lower abdominal pain, unspecified: Secondary | ICD-10-CM | POA: Diagnosis not present

## 2020-09-27 LAB — CBC
HCT: 36.3 % (ref 36.0–46.0)
Hemoglobin: 11.9 g/dL — ABNORMAL LOW (ref 12.0–15.0)
MCH: 30.6 pg (ref 26.0–34.0)
MCHC: 32.8 g/dL (ref 30.0–36.0)
MCV: 93.3 fL (ref 80.0–100.0)
Platelets: 343 10*3/uL (ref 150–400)
RBC: 3.89 MIL/uL (ref 3.87–5.11)
RDW: 13.1 % (ref 11.5–15.5)
WBC: 10.1 10*3/uL (ref 4.0–10.5)
nRBC: 0 % (ref 0.0–0.2)

## 2020-09-27 LAB — COMPREHENSIVE METABOLIC PANEL
ALT: 13 U/L (ref 0–44)
AST: 24 U/L (ref 15–41)
Albumin: 3.9 g/dL (ref 3.5–5.0)
Alkaline Phosphatase: 78 U/L (ref 38–126)
Anion gap: 9 (ref 5–15)
BUN: 12 mg/dL (ref 8–23)
CO2: 25 mmol/L (ref 22–32)
Calcium: 9.2 mg/dL (ref 8.9–10.3)
Chloride: 101 mmol/L (ref 98–111)
Creatinine, Ser: 0.64 mg/dL (ref 0.44–1.00)
GFR, Estimated: >60 mL/min (ref >60)
Glucose, Bld: 133 mg/dL — ABNORMAL HIGH (ref 70–99)
Potassium: 4 mmol/L (ref 3.5–5.1)
Sodium: 135 mmol/L (ref 135–145)
Total Bilirubin: 1 mg/dL (ref 0.3–1.2)
Total Protein: 7.3 g/dL (ref 6.5–8.1)

## 2020-09-27 LAB — URINALYSIS, ROUTINE W REFLEX MICROSCOPIC
Bilirubin Urine: NEGATIVE
Glucose, UA: NEGATIVE mg/dL
Ketones, ur: NEGATIVE mg/dL
Nitrite: NEGATIVE
Protein, ur: NEGATIVE mg/dL
Specific Gravity, Urine: 1.015 (ref 1.005–1.030)
pH: 7 (ref 5.0–8.0)

## 2020-09-27 LAB — URINALYSIS, MICROSCOPIC (REFLEX)

## 2020-09-27 LAB — LIPASE, BLOOD: Lipase: 130 U/L — ABNORMAL HIGH (ref 11–51)

## 2020-09-27 MED ORDER — SODIUM CHLORIDE 0.9 % IV BOLUS
1000.0000 mL | Freq: Once | INTRAVENOUS | Status: AC
  Administered 2020-09-28: 1000 mL via INTRAVENOUS

## 2020-09-27 MED ORDER — MORPHINE SULFATE (PF) 4 MG/ML IV SOLN
4.0000 mg | Freq: Once | INTRAVENOUS | Status: AC
  Administered 2020-09-28: 4 mg via INTRAVENOUS
  Filled 2020-09-27: qty 1

## 2020-09-27 NOTE — ED Provider Notes (Signed)
[facility] Emergency Department Provider Note MRN:  [id number]  Arrival date & time: 09/28/20     Chief Complaint   Abdominal Pain   History of Present Illness   Taylor Mclaughlin is a 75 y.o. year-old female with no pertinent past medical history presenting to the ED with chief complaint of abdominal pain.  Location: Left lower quadrant Duration: About 6 hours Onset: Gradual Timing: Constant Description: Dull ache Severity: Severe, currently 8 out of 10 Exacerbating/Alleviating Factors: None Associated Symptoms: Nausea, multiple episodes of nonbloody nonbilious emesis Pertinent Negatives: No fever, no chest pain or shortness of breath, no dysuria or hematuria  Additional History: None  Review of Systems  A complete 10 system review of systems was obtained and all systems are negative except as noted in the HPI and PMH.   Patient's Health History    Past Medical History:  Diagnosis Date   Arthritis    Prolapse of female pelvic organs    Urgency of urination     Past Surgical History:  Procedure Laterality Date   ABDOMINAL HYSTERECTOMY     CYSTO N/A 12/30/2013   Procedure: CYSTO;  Surgeon: Ardis Hughs, MD;  Location: [facility];  Service: Urology;  Laterality: N/A;   HAND SURGERY  2000   L THUMB   ROBOTIC ASSISTED LAPAROSCOPIC HYSTERECTOMY AND SALPINGECTOMY N/A 12/30/2013   Procedure: ROBOTIC ASSISTED LAPAROSCOPIC HYSTERECTOMY AND SACROCOLPOPEXY;  Surgeon: Ardis Hughs, MD;  Location: [facility];  Service: Urology;  Laterality: N/A;   TONSILLECTOMY  1952   TUBAL LIGATION  1975   WISDOM TOOTH EXTRACTION  1993    Family History  Problem Relation Age of Onset   Hyperlipidemia Mother    Parkinson's disease Father    Macular degeneration Brother    Depression Daughter    Depression Son     Social History   Socioeconomic History   Marital status: Married    Spouse name: Not on file   Number of children: Not on file   Years of  education: Not on file   Highest education level: Not on file  Occupational History   Not on file  Tobacco Use   Smoking status: Never   Smokeless tobacco: Never  Substance and Sexual Activity   Alcohol use: Yes    Comment: OCCASIONAL   Drug use: No   Sexual activity: Not on file  Other Topics Concern   Not on file  Social History Narrative   Not on file   Social Determinants of Health   Financial Resource Strain: Not on file  Food Insecurity: Not on file  Transportation Needs: Not on file  Physical Activity: Not on file  Stress: Not on file  Social Connections: Not on file  Intimate Partner Violence: Not on file     Physical Exam   Vitals:   09/28/20 0130 09/28/20 0145  BP: 137/67 132/61  Pulse: 88 93  Resp: 13 14  Temp:    SpO2: 98% 98%    CONSTITUTIONAL: Well-appearing, NAD NEURO:  Alert and oriented x 3, no focal deficits EYES:  eyes equal and reactive ENT/NECK:  no LAD, no JVD CARDIO: Regular rate, well-perfused, normal S1 and S2 PULM:  CTAB no wheezing or rhonchi GI/GU:  normal bowel sounds, non-distended, moderate diffuse tenderness MSK/SPINE:  No gross deformities, no edema SKIN:  no rash, atraumatic PSYCH:  Appropriate speech and behavior  *Additional and/or pertinent findings included in MDM below  Diagnostic and Interventional Summary  EKG Interpretation  Date/Time:    Ventricular Rate:    PR Interval:    QRS Duration:   QT Interval:    QTC Calculation:   R Axis:     Text Interpretation:         Labs Reviewed  LIPASE, BLOOD - Abnormal; Notable for the following components:      Result Value   Lipase 130 (*)    All other components within normal limits  COMPREHENSIVE METABOLIC PANEL - Abnormal; Notable for the following components:   Glucose, Bld 133 (*)    All other components within normal limits  CBC - Abnormal; Notable for the following components:   Hemoglobin 11.9 (*)    All other components within normal limits   URINALYSIS, ROUTINE W REFLEX MICROSCOPIC - Abnormal; Notable for the following components:   APPearance CLOUDY (*)    Hgb urine dipstick SMALL (*)    Leukocytes,Ua MODERATE (*)    All other components within normal limits  URINALYSIS, MICROSCOPIC (REFLEX) - Abnormal; Notable for the following components:   Bacteria, UA FEW (*)    All other components within normal limits    CT ABDOMEN PELVIS W CONTRAST  Final Result      Medications  sodium chloride 0.9 % bolus 1,000 mL (1,000 mLs Intravenous New Bag/Given 09/28/20 0016)  morphine 4 MG/ML injection 4 mg (4 mg Intravenous Given 09/28/20 0013)  iohexol (OMNIPAQUE) 300 MG/ML solution 80 mL (80 mLs Intravenous Contrast Given 09/28/20 0009)     Procedures  /  Critical Care Procedures  ED Course and Medical Decision Making  I have reviewed the triage vital signs, the nursing notes, and pertinent available records from the EMR.  Listed above are laboratory and imaging tests that I personally ordered, reviewed, and interpreted and then considered in my medical decision making (see below for details).  Concern for diverticulitis versus kidney stone, awaiting CT.  Labs showing some weak evidence for pancreatitis.     CT reveals 3 mm obstructing kidney stone.  Labs are reassuring, normal kidney function, no evidence of UTI.  On reassessment pain is well controlled, normal vital signs, patient is appropriate for discharge.  [name], [place] [email or web address]  Final Clinical Impressions(s) / ED Diagnoses     ICD-10-CM   1. Kidney stone  N20.0       ED Discharge Orders          Ordered    tamsulosin (FLOMAX) 0.4 MG CAPS capsule  Daily        09/28/20 0150    oxyCODONE (ROXICODONE) 5 MG immediate release tablet  Every 4 hours PRN        09/28/20 0150             Discharge Instructions Discussed with and Provided to Patient:     Discharge Instructions       You were evaluated in the Emergency Department and after careful evaluation, we did not find any emergent condition requiring admission or further testing in the hospital.  Your exam/testing today was overall reassuring.  Symptoms seem to be due to a kidney stone.  Recommend follow-up with your urologist.  Strain your urine as we discussed.  Use the Flomax provided to help pass the stone.  Recommend Tylenol 1000 mg every 4-6 hours and/or Motrin 600 mg every 4-6 hours for pain.  You can use the oxycodone medication for more significant pain.  Please return to the  Emergency Department if you experience any worsening of your condition.  Thank you for allowing [place] to be a part of your care.         Maudie Flakes, MD 09/28/20 [phone]

## 2020-09-27 NOTE — ED Triage Notes (Signed)
The pt arrived by [facility]  pt  had a sudden onset of left lower abd pain since 1700 with nausea  some vomiting.  She refused iv zofran by ems

## 2020-09-28 ENCOUNTER — Emergency Department (HOSPITAL_COMMUNITY): Payer: PPO

## 2020-09-28 DIAGNOSIS — K760 Fatty (change of) liver, not elsewhere classified: Secondary | ICD-10-CM | POA: Diagnosis not present

## 2020-09-28 DIAGNOSIS — R1110 Vomiting, unspecified: Secondary | ICD-10-CM | POA: Diagnosis not present

## 2020-09-28 DIAGNOSIS — R109 Unspecified abdominal pain: Secondary | ICD-10-CM | POA: Diagnosis not present

## 2020-09-28 MED ORDER — OXYCODONE HCL 5 MG PO TABS: 5.0000 mg | ORAL_TABLET | ORAL | 0 refills | Status: DC | PRN

## 2020-09-28 MED ORDER — IOHEXOL 300 MG/ML  SOLN
80.0000 mL | Freq: Once | INTRAMUSCULAR | Status: AC | PRN
  Administered 2020-09-28: 80 mL via INTRAVENOUS

## 2020-09-28 MED ORDER — TAMSULOSIN HCL 0.4 MG PO CAPS: 0.4000 mg | ORAL_CAPSULE | Freq: Every day | ORAL | 0 refills | Status: DC

## 2020-09-28 NOTE — Discharge Instructions (Addendum)
You were evaluated in the Emergency Department and after careful evaluation, we did not find any emergent condition requiring admission or further testing in the hospital.  Your exam/testing today was overall reassuring.  Symptoms seem to be due to a kidney stone.  Recommend follow-up with your urologist.  Strain your urine as we discussed.  Use the Flomax provided to help pass the stone.  Recommend Tylenol 1000 mg every 4-6 hours and/or Motrin 600 mg every 4-6 hours for pain.  You can use the oxycodone medication for more significant pain.  Please return to the Emergency Department if you experience any worsening of your condition.  Thank you for allowing [place] to be a part of your care.

## 2020-09-28 NOTE — ED Notes (Signed)
E-signature pad unavailable at time of pt discharge. This RN discussed discharge materials with pt and answered all pt questions. Pt stated understanding of discharge material. ? ?

## 2020-10-05 ENCOUNTER — Encounter: Payer: Self-pay | Admitting: Registered Nurse

## 2020-10-06 ENCOUNTER — Other Ambulatory Visit: Payer: Self-pay

## 2020-10-06 DIAGNOSIS — N200 Calculus of kidney: Secondary | ICD-10-CM

## 2020-10-06 MED ORDER — TAMSULOSIN HCL 0.4 MG PO CAPS
0.4000 mg | ORAL_CAPSULE | Freq: Every day | ORAL | 0 refills | Status: DC
Start: 2020-10-06 — End: 2021-05-13

## 2020-10-06 NOTE — Telephone Encounter (Signed)
Recommend continue flomax and hydrate, hydrate, hydrate, hydrate. If persistent, should be seen in office  Thanks  Rich

## 2020-10-21 ENCOUNTER — Encounter: Payer: Self-pay | Admitting: Registered Nurse

## 2020-10-21 ENCOUNTER — Other Ambulatory Visit: Payer: Self-pay

## 2020-10-21 DIAGNOSIS — R0982 Postnasal drip: Secondary | ICD-10-CM

## 2020-10-21 MED ORDER — FLUTICASONE PROPIONATE 50 MCG/ACT NA SUSP
2.0000 | Freq: Every day | NASAL | 6 refills | Status: DC
Start: 2020-10-21 — End: 2021-05-13

## 2020-10-29 DIAGNOSIS — M1711 Unilateral primary osteoarthritis, right knee: Secondary | ICD-10-CM | POA: Diagnosis not present

## 2020-11-16 DIAGNOSIS — N209 Urinary calculus, unspecified: Secondary | ICD-10-CM | POA: Diagnosis not present

## 2020-12-14 DIAGNOSIS — M25461 Effusion, right knee: Secondary | ICD-10-CM | POA: Diagnosis not present

## 2020-12-14 DIAGNOSIS — M1711 Unilateral primary osteoarthritis, right knee: Secondary | ICD-10-CM | POA: Diagnosis not present

## 2020-12-14 DIAGNOSIS — M25561 Pain in right knee: Secondary | ICD-10-CM | POA: Diagnosis not present

## 2021-02-01 DIAGNOSIS — M9906 Segmental and somatic dysfunction of lower extremity: Secondary | ICD-10-CM | POA: Diagnosis not present

## 2021-02-01 DIAGNOSIS — M1711 Unilateral primary osteoarthritis, right knee: Secondary | ICD-10-CM | POA: Diagnosis not present

## 2021-02-01 DIAGNOSIS — M11261 Other chondrocalcinosis, right knee: Secondary | ICD-10-CM | POA: Diagnosis not present

## 2021-02-01 DIAGNOSIS — M25561 Pain in right knee: Secondary | ICD-10-CM | POA: Diagnosis not present

## 2021-02-04 DIAGNOSIS — M11261 Other chondrocalcinosis, right knee: Secondary | ICD-10-CM | POA: Diagnosis not present

## 2021-02-04 DIAGNOSIS — M25561 Pain in right knee: Secondary | ICD-10-CM | POA: Diagnosis not present

## 2021-02-04 DIAGNOSIS — M1711 Unilateral primary osteoarthritis, right knee: Secondary | ICD-10-CM | POA: Diagnosis not present

## 2021-02-09 DIAGNOSIS — M25561 Pain in right knee: Secondary | ICD-10-CM | POA: Diagnosis not present

## 2021-02-09 DIAGNOSIS — M11261 Other chondrocalcinosis, right knee: Secondary | ICD-10-CM | POA: Diagnosis not present

## 2021-02-09 DIAGNOSIS — M1711 Unilateral primary osteoarthritis, right knee: Secondary | ICD-10-CM | POA: Diagnosis not present

## 2021-02-12 DIAGNOSIS — M11261 Other chondrocalcinosis, right knee: Secondary | ICD-10-CM | POA: Diagnosis not present

## 2021-02-12 DIAGNOSIS — M1711 Unilateral primary osteoarthritis, right knee: Secondary | ICD-10-CM | POA: Diagnosis not present

## 2021-02-12 DIAGNOSIS — M25561 Pain in right knee: Secondary | ICD-10-CM | POA: Diagnosis not present

## 2021-02-19 DIAGNOSIS — M11261 Other chondrocalcinosis, right knee: Secondary | ICD-10-CM | POA: Diagnosis not present

## 2021-02-19 DIAGNOSIS — M1711 Unilateral primary osteoarthritis, right knee: Secondary | ICD-10-CM | POA: Diagnosis not present

## 2021-02-19 DIAGNOSIS — M25561 Pain in right knee: Secondary | ICD-10-CM | POA: Diagnosis not present

## 2021-02-24 DIAGNOSIS — M1711 Unilateral primary osteoarthritis, right knee: Secondary | ICD-10-CM | POA: Diagnosis not present

## 2021-02-24 DIAGNOSIS — M25561 Pain in right knee: Secondary | ICD-10-CM | POA: Diagnosis not present

## 2021-02-24 DIAGNOSIS — M11261 Other chondrocalcinosis, right knee: Secondary | ICD-10-CM | POA: Diagnosis not present

## 2021-02-26 DIAGNOSIS — M11261 Other chondrocalcinosis, right knee: Secondary | ICD-10-CM | POA: Diagnosis not present

## 2021-02-26 DIAGNOSIS — M25561 Pain in right knee: Secondary | ICD-10-CM | POA: Diagnosis not present

## 2021-02-26 DIAGNOSIS — M1711 Unilateral primary osteoarthritis, right knee: Secondary | ICD-10-CM | POA: Diagnosis not present

## 2021-03-01 DIAGNOSIS — M1711 Unilateral primary osteoarthritis, right knee: Secondary | ICD-10-CM | POA: Diagnosis not present

## 2021-03-01 DIAGNOSIS — M11261 Other chondrocalcinosis, right knee: Secondary | ICD-10-CM | POA: Diagnosis not present

## 2021-03-01 DIAGNOSIS — M25561 Pain in right knee: Secondary | ICD-10-CM | POA: Diagnosis not present

## 2021-03-03 DIAGNOSIS — M11261 Other chondrocalcinosis, right knee: Secondary | ICD-10-CM | POA: Diagnosis not present

## 2021-03-03 DIAGNOSIS — M1711 Unilateral primary osteoarthritis, right knee: Secondary | ICD-10-CM | POA: Diagnosis not present

## 2021-03-03 DIAGNOSIS — M25561 Pain in right knee: Secondary | ICD-10-CM | POA: Diagnosis not present

## 2021-03-08 DIAGNOSIS — M1711 Unilateral primary osteoarthritis, right knee: Secondary | ICD-10-CM | POA: Diagnosis not present

## 2021-03-08 DIAGNOSIS — M11261 Other chondrocalcinosis, right knee: Secondary | ICD-10-CM | POA: Diagnosis not present

## 2021-03-08 DIAGNOSIS — M25561 Pain in right knee: Secondary | ICD-10-CM | POA: Diagnosis not present

## 2021-03-10 DIAGNOSIS — M1711 Unilateral primary osteoarthritis, right knee: Secondary | ICD-10-CM | POA: Diagnosis not present

## 2021-03-10 DIAGNOSIS — M25461 Effusion, right knee: Secondary | ICD-10-CM | POA: Diagnosis not present

## 2021-03-10 DIAGNOSIS — M25561 Pain in right knee: Secondary | ICD-10-CM | POA: Diagnosis not present

## 2021-03-11 DIAGNOSIS — M25561 Pain in right knee: Secondary | ICD-10-CM | POA: Diagnosis not present

## 2021-03-11 DIAGNOSIS — M11261 Other chondrocalcinosis, right knee: Secondary | ICD-10-CM | POA: Diagnosis not present

## 2021-03-11 DIAGNOSIS — M1711 Unilateral primary osteoarthritis, right knee: Secondary | ICD-10-CM | POA: Diagnosis not present

## 2021-03-15 DIAGNOSIS — M1711 Unilateral primary osteoarthritis, right knee: Secondary | ICD-10-CM | POA: Diagnosis not present

## 2021-03-15 DIAGNOSIS — M11261 Other chondrocalcinosis, right knee: Secondary | ICD-10-CM | POA: Diagnosis not present

## 2021-03-15 DIAGNOSIS — M25561 Pain in right knee: Secondary | ICD-10-CM | POA: Diagnosis not present

## 2021-03-17 DIAGNOSIS — M11261 Other chondrocalcinosis, right knee: Secondary | ICD-10-CM | POA: Diagnosis not present

## 2021-03-17 DIAGNOSIS — M1711 Unilateral primary osteoarthritis, right knee: Secondary | ICD-10-CM | POA: Diagnosis not present

## 2021-03-17 DIAGNOSIS — M25561 Pain in right knee: Secondary | ICD-10-CM | POA: Diagnosis not present

## 2021-03-23 DIAGNOSIS — M25561 Pain in right knee: Secondary | ICD-10-CM | POA: Diagnosis not present

## 2021-03-23 DIAGNOSIS — M1711 Unilateral primary osteoarthritis, right knee: Secondary | ICD-10-CM | POA: Diagnosis not present

## 2021-03-23 DIAGNOSIS — M11261 Other chondrocalcinosis, right knee: Secondary | ICD-10-CM | POA: Diagnosis not present

## 2021-03-26 DIAGNOSIS — M25561 Pain in right knee: Secondary | ICD-10-CM | POA: Diagnosis not present

## 2021-03-26 DIAGNOSIS — M11261 Other chondrocalcinosis, right knee: Secondary | ICD-10-CM | POA: Diagnosis not present

## 2021-03-26 DIAGNOSIS — M1711 Unilateral primary osteoarthritis, right knee: Secondary | ICD-10-CM | POA: Diagnosis not present

## 2021-03-29 DIAGNOSIS — M1711 Unilateral primary osteoarthritis, right knee: Secondary | ICD-10-CM | POA: Diagnosis not present

## 2021-03-29 DIAGNOSIS — M11261 Other chondrocalcinosis, right knee: Secondary | ICD-10-CM | POA: Diagnosis not present

## 2021-03-29 DIAGNOSIS — M25561 Pain in right knee: Secondary | ICD-10-CM | POA: Diagnosis not present

## 2021-04-01 DIAGNOSIS — M1711 Unilateral primary osteoarthritis, right knee: Secondary | ICD-10-CM | POA: Diagnosis not present

## 2021-04-01 DIAGNOSIS — M11261 Other chondrocalcinosis, right knee: Secondary | ICD-10-CM | POA: Diagnosis not present

## 2021-04-01 DIAGNOSIS — M25561 Pain in right knee: Secondary | ICD-10-CM | POA: Diagnosis not present

## 2021-04-06 DIAGNOSIS — M1711 Unilateral primary osteoarthritis, right knee: Secondary | ICD-10-CM | POA: Diagnosis not present

## 2021-04-06 DIAGNOSIS — M25561 Pain in right knee: Secondary | ICD-10-CM | POA: Diagnosis not present

## 2021-04-06 DIAGNOSIS — M11261 Other chondrocalcinosis, right knee: Secondary | ICD-10-CM | POA: Diagnosis not present

## 2021-04-09 DIAGNOSIS — M11261 Other chondrocalcinosis, right knee: Secondary | ICD-10-CM | POA: Diagnosis not present

## 2021-04-09 DIAGNOSIS — M25561 Pain in right knee: Secondary | ICD-10-CM | POA: Diagnosis not present

## 2021-04-09 DIAGNOSIS — M1711 Unilateral primary osteoarthritis, right knee: Secondary | ICD-10-CM | POA: Diagnosis not present

## 2021-04-14 DIAGNOSIS — M1711 Unilateral primary osteoarthritis, right knee: Secondary | ICD-10-CM | POA: Diagnosis not present

## 2021-04-14 DIAGNOSIS — M11261 Other chondrocalcinosis, right knee: Secondary | ICD-10-CM | POA: Diagnosis not present

## 2021-04-14 DIAGNOSIS — M25561 Pain in right knee: Secondary | ICD-10-CM | POA: Diagnosis not present

## 2021-04-16 DIAGNOSIS — M11261 Other chondrocalcinosis, right knee: Secondary | ICD-10-CM | POA: Diagnosis not present

## 2021-04-16 DIAGNOSIS — M25561 Pain in right knee: Secondary | ICD-10-CM | POA: Diagnosis not present

## 2021-04-16 DIAGNOSIS — M1711 Unilateral primary osteoarthritis, right knee: Secondary | ICD-10-CM | POA: Diagnosis not present

## 2021-04-19 DIAGNOSIS — M11261 Other chondrocalcinosis, right knee: Secondary | ICD-10-CM | POA: Diagnosis not present

## 2021-04-19 DIAGNOSIS — M25461 Effusion, right knee: Secondary | ICD-10-CM | POA: Diagnosis not present

## 2021-04-19 DIAGNOSIS — M25561 Pain in right knee: Secondary | ICD-10-CM | POA: Diagnosis not present

## 2021-04-20 DIAGNOSIS — M11261 Other chondrocalcinosis, right knee: Secondary | ICD-10-CM | POA: Diagnosis not present

## 2021-04-20 DIAGNOSIS — M25561 Pain in right knee: Secondary | ICD-10-CM | POA: Diagnosis not present

## 2021-04-20 DIAGNOSIS — M1711 Unilateral primary osteoarthritis, right knee: Secondary | ICD-10-CM | POA: Diagnosis not present

## 2021-04-23 DIAGNOSIS — M11261 Other chondrocalcinosis, right knee: Secondary | ICD-10-CM | POA: Diagnosis not present

## 2021-04-23 DIAGNOSIS — M25561 Pain in right knee: Secondary | ICD-10-CM | POA: Diagnosis not present

## 2021-04-23 DIAGNOSIS — M1711 Unilateral primary osteoarthritis, right knee: Secondary | ICD-10-CM | POA: Diagnosis not present

## 2021-04-26 DIAGNOSIS — M11261 Other chondrocalcinosis, right knee: Secondary | ICD-10-CM | POA: Diagnosis not present

## 2021-04-26 DIAGNOSIS — M1711 Unilateral primary osteoarthritis, right knee: Secondary | ICD-10-CM | POA: Diagnosis not present

## 2021-04-26 DIAGNOSIS — M25561 Pain in right knee: Secondary | ICD-10-CM | POA: Diagnosis not present

## 2021-05-04 DIAGNOSIS — M1711 Unilateral primary osteoarthritis, right knee: Secondary | ICD-10-CM | POA: Diagnosis not present

## 2021-05-12 ENCOUNTER — Ambulatory Visit (INDEPENDENT_AMBULATORY_CARE_PROVIDER_SITE_OTHER): Payer: PPO | Admitting: Registered Nurse

## 2021-05-12 ENCOUNTER — Encounter: Payer: Self-pay | Admitting: Registered Nurse

## 2021-05-12 ENCOUNTER — Other Ambulatory Visit: Payer: Self-pay

## 2021-05-12 VITALS — BP 131/61 | HR 67 | Temp 97.6°F | Resp 18 | Ht 62.0 in | Wt 108.6 lb

## 2021-05-12 DIAGNOSIS — Z01818 Encounter for other preprocedural examination: Secondary | ICD-10-CM

## 2021-05-12 LAB — COMPREHENSIVE METABOLIC PANEL
ALT: 16 U/L (ref 0–35)
AST: 22 U/L (ref 0–37)
Albumin: 4.2 g/dL (ref 3.5–5.2)
Alkaline Phosphatase: 87 U/L (ref 39–117)
BUN: 13 mg/dL (ref 6–23)
CO2: 28 mEq/L (ref 19–32)
Calcium: 9.2 mg/dL (ref 8.4–10.5)
Chloride: 103 mEq/L (ref 96–112)
Creatinine, Ser: 0.58 mg/dL (ref 0.40–1.20)
GFR: 88.28 mL/min (ref >60.00)
Glucose, Bld: 90 mg/dL (ref 70–99)
Potassium: 4.1 mEq/L (ref 3.5–5.1)
Sodium: 139 mEq/L (ref 135–145)
Total Bilirubin: 0.8 mg/dL (ref 0.2–1.2)
Total Protein: 6.9 g/dL (ref 6.0–8.3)

## 2021-05-12 LAB — CBC WITH DIFFERENTIAL/PLATELET
Basophils Absolute: 0 K/uL (ref 0.0–0.1)
Basophils Relative: 0.7 % (ref 0.0–3.0)
Eosinophils Absolute: 0.3 K/uL (ref 0.0–0.7)
Eosinophils Relative: 5.1 % — ABNORMAL HIGH (ref 0.0–5.0)
HCT: 33.9 % — ABNORMAL LOW (ref 36.0–46.0)
Hemoglobin: 11.6 g/dL — ABNORMAL LOW (ref 12.0–15.0)
Lymphocytes Relative: 21.9 % (ref 12.0–46.0)
Lymphs Abs: 1.1 K/uL (ref 0.7–4.0)
MCHC: 34.3 g/dL (ref 30.0–36.0)
MCV: 92.8 fl (ref 78.0–100.0)
Monocytes Absolute: 0.5 K/uL (ref 0.1–1.0)
Monocytes Relative: 10.9 % (ref 3.0–12.0)
Neutro Abs: 3.1 K/uL (ref 1.4–7.7)
Neutrophils Relative %: 61.4 % (ref 43.0–77.0)
Platelets: 417 K/uL — ABNORMAL HIGH (ref 150.0–400.0)
RBC: 3.65 Mil/uL — ABNORMAL LOW (ref 3.87–5.11)
RDW: 12.8 % (ref 11.5–15.5)
WBC: 5 K/uL (ref 4.0–10.5)

## 2021-05-12 LAB — URINALYSIS, ROUTINE W REFLEX MICROSCOPIC
Bilirubin Urine: NEGATIVE
Hgb urine dipstick: NEGATIVE
Ketones, ur: NEGATIVE
Leukocytes,Ua: NEGATIVE
Nitrite: NEGATIVE
RBC / HPF: NONE SEEN
Specific Gravity, Urine: 1.01 (ref 1.000–1.030)
Total Protein, Urine: NEGATIVE
Urine Glucose: NEGATIVE
Urobilinogen, UA: 0.2 (ref 0.0–1.0)
pH: 7 (ref 5.0–8.0)

## 2021-05-12 LAB — TSH: TSH: 0.68 u[IU]/mL (ref 0.35–5.50)

## 2021-05-12 LAB — HEMOGLOBIN A1C: Hgb A1c MFr Bld: 6 % (ref 4.6–6.5)

## 2021-05-12 NOTE — Progress Notes (Signed)
? ?Established Patient Office Visit ? ?Subjective:  ?Patient ID: Taylor Mclaughlin, female    DOB: 11/18/1945  Age: 76 y.o. MRN: [id number] ? ?CC:  ?Chief Complaint  ?Patient presents with  ? Medical Clearance  ?  Patient states she is here for surgical clearance for knee replacement.  ? ? ?HPI ?Taylor Mclaughlin presents for pre op exam ? ?Upcoming TKA. ?Managed by ortho but needs CV and medical clearance ?Histories reviewed and updated with patient.  ? ?No acute concerns.  ? ?Outpatient Medications Prior to Visit  ?Medication Sig Dispense Refill  ? Calcium Carbonate-Vitamin D 500-125 MG-UNIT TABS Take by mouth.    ? GLUCOSAMINE CHONDROITIN COMPLX PO Take 1 tablet by mouth daily.    ? Multiple Vitamins-Minerals (MULTIVITAMIN ADULT PO) Take 500 mg by mouth daily.    ? TURMERIC PO Take 500 mg by mouth daily.     ? Calcium Carb-Cholecalciferol 600-800 MG-UNIT TABS Take by mouth. (Patient not taking: Reported on 09/09/2020)    ? fluticasone (FLONASE) 50 MCG/ACT nasal spray Place 2 sprays into both nostrils daily. 16 g 6  ? MAGNESIUM PO Take by mouth daily. (Patient not taking: Reported on 09/09/2020)    ? oxyCODONE (ROXICODONE) 5 MG immediate release tablet Take 1 tablet (5 mg total) by mouth every 4 (four) hours as needed for severe pain. 10 tablet 0  ? tamsulosin (FLOMAX) 0.4 MG CAPS capsule Take 1 capsule (0.4 mg total) by mouth daily. 7 capsule 0  ? ?No facility-administered medications prior to visit.  ? ? ?Review of Systems  ?Constitutional: Negative.   ?HENT: Negative.    ?Eyes: Negative.   ?Respiratory: Negative.    ?Cardiovascular: Negative.   ?Gastrointestinal: Negative.   ?Genitourinary: Negative.   ?Musculoskeletal: Negative.   ?Skin: Negative.   ?Neurological: Negative.   ?Psychiatric/Behavioral: Negative.    ?All other systems reviewed and are negative. ? ?  ?Objective:  ?  ? ?BP 131/61   Pulse 67   Temp 97.6 ?F (36.4 ?C) (Temporal)   Resp 18   Ht '5\' 2"'$  (1.575 m)   Wt 108 lb 9.6 oz (49.3 kg)   SpO2 96%   BMI  19.86 kg/m?  ? ?Wt Readings from Last 3 Encounters:  ?05/12/21 108 lb 9.6 oz (49.3 kg)  ?09/27/20 110 lb 3.7 oz (50 kg)  ?09/09/20 110 lb 3.2 oz (50 kg)  ? ?Physical Exam ?Vitals and nursing note reviewed.  ?Constitutional:   ?   General: She is not in acute distress. ?   Appearance: Normal appearance. She is normal weight. She is not ill-appearing, toxic-appearing or diaphoretic.  ?Cardiovascular:  ?   Rate and Rhythm: Normal rate and regular rhythm.  ?   Heart sounds: Normal heart sounds. No murmur heard. ?  No friction rub. No gallop.  ?Pulmonary:  ?   Effort: Pulmonary effort is normal. No respiratory distress.  ?   Breath sounds: Normal breath sounds. No stridor. No wheezing, rhonchi or rales.  ?Chest:  ?   Chest wall: No tenderness.  ?Skin: ?   General: Skin is warm and dry.  ?Neurological:  ?   General: No focal deficit present.  ?   Mental Status: She is alert and oriented to person, place, and time. Mental status is at baseline.  ?Psychiatric:     ?   Mood and Affect: Mood normal.     ?   Behavior: Behavior normal.     ?   Thought Content: Thought content normal.     ?  Judgment: Judgment normal.  ? ? ?No results found for any visits on 05/12/21. ? ? ? ?The 10-year ASCVD risk score (Arnett DK, et al., 2019) is: 23.5% ? ?  ?Assessment & Plan:  ? ?Problem List Items Addressed This Visit   ?None ?Visit Diagnoses   ? ? Pre-op examination    -  Primary  ? Relevant Orders  ? EKG 12-Lead (Completed)  ? CBC with Differential/Platelet  ? Comprehensive metabolic panel  ? Hemoglobin A1c  ? TSH  ? Urinalysis, Routine w reflex microscopic  ? APTT  ? Protime-INR  ? ?  ? ? ?No orders of the defined types were placed in this encounter. ? ? ?Return if symptoms worsen or fail to improve.  ? ?PLAN ?Preop labs obtained. Follow up as indicated. ?EKG obtained. No comparison available. No acute concerns, RRR.  ?If labs wnl, ok for surgery. ?Patient encouraged to call clinic with any questions, comments, or concerns. ? ? ?Maximiano Coss, NP ?

## 2021-05-12 NOTE — Patient Instructions (Addendum)
Ms. Gal - ? ?Great to see you ? ?Call with concerns ? ?I'll let you know how labs look ? ?EKG is ok ? ?Good luck with surgery! ? ?Thanks, ? ?Rich  ? ? ? ?If you have lab work done today you will be contacted with your lab results within the next 2 weeks.  If you have not heard from [place] then please contact us. The fastest way to get your results is to register for My Chart. ? ? ?IF you received an x-ray today, you will receive an invoice from [facility]. Please contact [facility] at [phone] with questions or concerns regarding your invoice.  ? ?IF you received labwork today, you will receive an invoice from [facility]. Please contact [facility] at [phone] with questions or concerns regarding your invoice.  ? ?Our billing staff will not be able to assist you with questions regarding bills from these companies. ? ?You will be contacted with the lab results as soon as they are available. The fastest way to get your results is to activate your My Chart account. Instructions are located on the last page of this paperwork. If you have not heard from [place] regarding the results in 2 weeks, please contact this office. ?  ? ? ?

## 2021-05-13 ENCOUNTER — Encounter: Payer: Self-pay | Admitting: Registered Nurse

## 2021-05-13 ENCOUNTER — Other Ambulatory Visit: Payer: Self-pay | Admitting: Registered Nurse

## 2021-05-13 DIAGNOSIS — Z01818 Encounter for other preprocedural examination: Secondary | ICD-10-CM

## 2021-05-13 NOTE — Telephone Encounter (Signed)
When you get a chance could you result these so that I could give patient a call. ?

## 2021-05-13 NOTE — Addendum Note (Signed)
Addended by: Veneda Melter on: 05/13/2021 04:04 PM ? ? Modules accepted: Orders ? ?

## 2021-05-14 ENCOUNTER — Encounter: Payer: Self-pay | Admitting: Family Medicine

## 2021-05-14 ENCOUNTER — Ambulatory Visit (INDEPENDENT_AMBULATORY_CARE_PROVIDER_SITE_OTHER): Payer: PPO | Admitting: Family Medicine

## 2021-05-14 VITALS — BP 108/56 | HR 62 | Temp 98.7°F | Resp 16 | Ht 62.0 in | Wt 111.4 lb

## 2021-05-14 DIAGNOSIS — Z01818 Encounter for other preprocedural examination: Secondary | ICD-10-CM | POA: Diagnosis not present

## 2021-05-14 DIAGNOSIS — H6121 Impacted cerumen, right ear: Secondary | ICD-10-CM

## 2021-05-14 LAB — PROTIME-INR
INR: 1.1 ratio — ABNORMAL HIGH (ref 0.8–1.0)
Prothrombin Time: 11.9 s (ref 9.6–13.1)

## 2021-05-14 LAB — APTT: aPTT: 35 s (ref 25.4–36.8)

## 2021-05-14 NOTE — Progress Notes (Signed)
   Subjective:    Patient ID: Taylor Mclaughlin, female    DOB: [birth year], 76 y.o.   MRN: [id number]  HPI R ear fullness- pt reports she has had decreased hearing and the sound of 'bubbling' in her ear.  Denies pain.  No drainage.  Would like wax removed today to improve hearing and so that she can wear her hearing aids.   Review of Systems For ROS see HPI     Objective:   Physical Exam Vitals reviewed.  Constitutional:      General: She is not in acute distress.    Appearance: Normal appearance. She is not ill-appearing.  HENT:     Head: Normocephalic and atraumatic.     Right Ear: External ear normal. There is impacted cerumen.     Left Ear: Tympanic membrane and ear canal normal.  Neurological:     General: No focal deficit present.     Mental Status: She is alert and oriented to person, place, and time.  Psychiatric:        Mood and Affect: Mood normal.        Behavior: Behavior normal.        Thought Content: Thought content normal.          Assessment & Plan:   Impacted Cerumen- R side only.  Pt gave consent for wax removal via curette and lavage.  Pt tolerated procedure w/o difficulty and was pleased w/ immediate results.

## 2021-05-21 DIAGNOSIS — H35341 Macular cyst, hole, or pseudohole, right eye: Secondary | ICD-10-CM | POA: Diagnosis not present

## 2021-05-21 DIAGNOSIS — H35371 Puckering of macula, right eye: Secondary | ICD-10-CM | POA: Diagnosis not present

## 2021-05-21 DIAGNOSIS — H2513 Age-related nuclear cataract, bilateral: Secondary | ICD-10-CM | POA: Diagnosis not present

## 2021-05-25 ENCOUNTER — Telehealth: Payer: Self-pay

## 2021-05-25 NOTE — Telephone Encounter (Signed)
Sent pre-op plan to scan.

## 2021-05-28 DIAGNOSIS — M1711 Unilateral primary osteoarthritis, right knee: Secondary | ICD-10-CM | POA: Diagnosis not present

## 2021-05-28 DIAGNOSIS — M25561 Pain in right knee: Secondary | ICD-10-CM | POA: Diagnosis not present

## 2021-05-28 DIAGNOSIS — M11261 Other chondrocalcinosis, right knee: Secondary | ICD-10-CM | POA: Diagnosis not present

## 2021-06-02 DIAGNOSIS — M11261 Other chondrocalcinosis, right knee: Secondary | ICD-10-CM | POA: Diagnosis not present

## 2021-06-02 DIAGNOSIS — M25561 Pain in right knee: Secondary | ICD-10-CM | POA: Diagnosis not present

## 2021-06-02 DIAGNOSIS — M1711 Unilateral primary osteoarthritis, right knee: Secondary | ICD-10-CM | POA: Diagnosis not present

## 2021-06-04 DIAGNOSIS — M1711 Unilateral primary osteoarthritis, right knee: Secondary | ICD-10-CM | POA: Diagnosis not present

## 2021-06-04 DIAGNOSIS — M11261 Other chondrocalcinosis, right knee: Secondary | ICD-10-CM | POA: Diagnosis not present

## 2021-06-04 DIAGNOSIS — M25561 Pain in right knee: Secondary | ICD-10-CM | POA: Diagnosis not present

## 2021-06-08 DIAGNOSIS — M11261 Other chondrocalcinosis, right knee: Secondary | ICD-10-CM | POA: Diagnosis not present

## 2021-06-08 DIAGNOSIS — M1711 Unilateral primary osteoarthritis, right knee: Secondary | ICD-10-CM | POA: Diagnosis not present

## 2021-06-08 DIAGNOSIS — M25561 Pain in right knee: Secondary | ICD-10-CM | POA: Diagnosis not present

## 2021-06-10 ENCOUNTER — Ambulatory Visit (INDEPENDENT_AMBULATORY_CARE_PROVIDER_SITE_OTHER): Payer: PPO

## 2021-06-10 DIAGNOSIS — Z0000 Encounter for general adult medical examination without abnormal findings: Secondary | ICD-10-CM

## 2021-06-10 NOTE — Progress Notes (Signed)
Subjective:   Taylor Mclaughlin is a 76 y.o. female who presents for Medicare Annual (Subsequent) preventive examination.   I connected with Emika Tiano  today by telephone and verified that I am speaking with the correct person using two identifiers. Location patient: home Location provider: work Persons participating in the virtual visit: patient, provider.   I discussed the limitations, risks, security and privacy concerns of performing an evaluation and management service by telephone and the availability of in person appointments. I also discussed with the patient that there may be a patient responsible charge related to this service. The patient expressed understanding and verbally consented to this telephonic visit.    Interactive audio and video telecommunications were attempted between this provider and patient, however failed, due to patient having technical difficulties OR patient did not have access to video capability.  We continued and completed visit with audio only.    Review of Systems     Cardiac Risk Factors include: advanced age (>61mn, >>75women)     Objective:    Today's Vitals   There is no height or weight on file to calculate BMI.     06/10/2021    2:08 PM 09/27/2020    7:12 PM 09/27/2020    7:05 PM 02/07/2018    8:34 AM 02/01/2017    2:33 PM 12/30/2013    1:00 PM 12/23/2013   10:34 AM  Advanced Directives  Does Patient Have a Medical Advance Directive? Yes No No Yes Yes Yes Yes  Type of [place]amed[place]of [place] will   [place] will Living will;Healthcare Power of [place] will [place] will  Does patient want to make changes to medical advance directive?      No - Patient declined   Copy of [place] Chart? No - copy requested   No - copy requested No - copy requested Yes Yes    Current Medications  (verified) Outpatient Encounter Medications as of 06/10/2021  Medication Sig   Calcium Carbonate-Vitamin D 500-125 MG-UNIT TABS Take by mouth.   Ferrous Sulfate Dried (SLOW RELEASE IRON) 45 MG TBCR Take by mouth.   GLUCOSAMINE CHONDROITIN COMPLX PO Take 1 tablet by mouth daily.   Multiple Vitamins-Minerals (MULTIVITAMIN ADULT PO) Take 500 mg by mouth daily.   TURMERIC PO Take 500 mg by mouth daily.    MAGNESIUM PO Take by mouth daily.   No facility-administered encounter medications on file as of 06/10/2021.    Allergies (verified) Patient has no known allergies.   History: Past Medical History:  Diagnosis Date   Arthritis    Prolapse of female pelvic organs    Urgency of urination    Past Surgical History:  Procedure Laterality Date   ABDOMINAL HYSTERECTOMY     CYSTO N/A 12/30/2013   Procedure: CYSTO;  Surgeon: BArdis Hughs MD;  Location: [facility];  Service: Urology;  Laterality: N/A;   HAND SURGERY  2000   L THUMB   ROBOTIC ASSISTED LAPAROSCOPIC HYSTERECTOMY AND SALPINGECTOMY N/A 12/30/2013   Procedure: ROBOTIC ASSISTED LAPAROSCOPIC HYSTERECTOMY AND SACROCOLPOPEXY;  Surgeon: BArdis Hughs MD;  Location: [facility];  Service: Urology;  Laterality: N/A;   TONSILLECTOMY  1952   TUBAL LIGATION  1975   WISDOM TOOTH EXTRACTION  1993   Family History  Problem Relation Age of Onset   Hyperlipidemia Mother    Parkinson's disease Father    Macular degeneration Brother  Depression Daughter    Depression Son    Social History   Socioeconomic History   Marital status: Married    Spouse name: Not on file   Number of children: Not on file   Years of education: Not on file   Highest education level: Not on file  Occupational History   Not on file  Tobacco Use   Smoking status: Never   Smokeless tobacco: Never  Substance and Sexual Activity   Alcohol use: Yes    Comment: OCCASIONAL   Drug use: No   Sexual activity: Not on file  Other Topics Concern   Not on file   Social History Narrative   Not on file   Social Determinants of Health   Financial Resource Strain: Low Risk  (06/10/2021)   Overall Financial Resource Strain (CARDIA)    Difficulty of Paying Living Expenses: Not hard at all  Food Insecurity: No Food Insecurity (06/10/2021)   Hunger Vital Sign    Worried About Running Out of Food in the Last Year: Never true    [place] in the Last Year: Never true  Transportation Needs: No Transportation Needs (06/10/2021)   PRAPARE - Hydrologist (Medical): No    Lack of Transportation (Non-Medical): No  Physical Activity: Insufficiently Active (06/10/2021)   Exercise Vital Sign    Days of Exercise per Week: 2 days    Minutes of Exercise per Session: 60 min  Stress: No Stress Concern Present (06/10/2021)   [place]    Feeling of Stress : Not at all  Social Connections: Moderately Integrated (06/10/2021)   Social Connection and Isolation Panel [NHANES]    Frequency of Communication with Friends and Family: Three times a week    Frequency of Social Gatherings with Friends and Family: Three times a week    Attends Religious Services: Never    Active Member of Clubs or Organizations: Yes    Attends Music therapist: More than 4 times per year    Marital Status: Married    Tobacco Counseling Counseling given: Not Answered   Clinical Intake:  Pre-visit preparation completed: Yes  Pain : No/denies pain     Nutritional Risks: None Diabetes: No  How often do you need to have someone help you when you read instructions, pamphlets, or other written materials from your doctor or pharmacy?: 1 - Never What is the last grade level you completed in school?: BA  Diabetic?no   Interpreter Needed?: No  Information entered by :: L.Evelise Reine,LPN   Activities of Daily Living    06/10/2021    2:10 PM 09/09/2020   10:43 AM  In your present state  of health, do you have any difficulty performing the following activities:  Hearing? 0 0  Vision? 0 0  Difficulty concentrating or making decisions? 0 0  Walking or climbing stairs? 0 0  Dressing or bathing? 0 0  Doing errands, shopping? 0 0  Preparing Food and eating ? N   Using the Toilet? N   In the past six months, have you accidently leaked urine? N   Do you have problems with loss of bowel control? N   Managing your Medications? N   Managing your Finances? N   Housekeeping or managing your Housekeeping? N     Patient Care Team: Midge Minium, MD as PCP - General (Family Medicine)  Indicate any recent Medical Services you may  have received from other than [facility] providers in the past year (date may be approximate).     Assessment:   This is a routine wellness examination for Farin.  Hearing/Vision screen Vision Screening - Comments:: Annual eye exams wear glasses   Dietary issues and exercise activities discussed: Current Exercise Habits: Home exercise routine, Type of exercise: walking, Time (Minutes): 30, Frequency (Times/Week): 3, Weekly Exercise (Minutes/Week): 90, Intensity: Mild, Exercise limited by: orthopedic condition(s)   Goals Addressed   None    Depression Screen    06/10/2021    2:08 PM 06/10/2021    2:05 PM 05/12/2021    7:57 AM 09/09/2020   10:43 AM 02/07/2018    8:58 AM 02/07/2018    8:36 AM 02/01/2017    2:34 PM  PHQ 2/9 Scores  PHQ - 2 Score 0 0 0 0 0 0 0  PHQ- 9 Score   2 0 0      Fall Risk    06/10/2021    2:08 PM 05/12/2021    7:57 AM 09/09/2020   10:43 AM 02/07/2018    8:59 AM 02/07/2018    8:36 AM  Fall Risk   Falls in the past year? 0 0 0 0 0  Number falls in past yr: 0 0 0 0   Injury with Fall? 0 0 0 0   Risk for fall due to :  No Fall Risks No Fall Risks    Follow up Falls evaluation completed Falls evaluation completed       [place]:  Any stairs in or around the home? Yes  If so, are there any  without handrails? No  Home free of loose throw rugs in walkways, pet beds, electrical cords, etc? Yes  Adequate lighting in your home to reduce risk of falls? Yes   ASSISTIVE DEVICES UTILIZED TO PREVENT FALLS:  Life alert? No  Use of a cane, walker or w/c? No  Grab bars in the bathroom? Yes  Shower chair or bench in shower? Yes  Elevated toilet seat or a handicapped toilet? No     Cognitive Function:  Normal cognitive status assessed by telephone conversation by this Nurse Health Advisor. No abnormalities found.      02/07/2018    8:38 AM 02/01/2017    2:37 PM  MMSE - Mini Mental State Exam  Orientation to time 5 5  Orientation to Place 5 5  Registration 3 3  Attention/ Calculation 5 5  Recall 3 3  Language- name 2 objects 2 2  Language- repeat 1 1  Language- follow 3 step command 3 3  Language- read & follow direction 1 1  Write a sentence 1 1  Copy design 1 1  Total score 30 30        Immunizations Immunization History  Administered Date(s) Administered   Fluad Quad(high Dose 65+) 10/10/2018, 10/09/2019   Influenza, High Dose Seasonal PF 10/09/2015, 10/18/2017   Influenza,inj,Quad PF,6+ Mos 10/04/2016   PFIZER(Purple Top)SARS-COV-2 Vaccination 02/09/2019, 03/06/2019, 10/18/2019, 08/03/2020   Pneumococcal Conjugate-13 08/20/2013   Pneumococcal Polysaccharide-23 04/08/2009, 02/26/2016   Tdap 07/04/2006, 02/07/2018   Zoster Recombinat (Shingrix) 05/16/2017   Zoster, Live 04/20/2009    TDAP status: Up to date  Flu Vaccine status: Up to date  Pneumococcal vaccine status: Up to date  Covid-19 vaccine status: Completed vaccines  Qualifies for Shingles Vaccine? Yes   Zostavax completed Yes   Shingrix Completed?: Yes  Screening Tests Health Maintenance  Topic  Date Due   Zoster Vaccines- Shingrix (2 of 2) 08/14/2021 (Originally 07/11/2017)   INFLUENZA VACCINE  08/03/2021   COLONOSCOPY (Pts 45-46yr Insurance coverage will need to be confirmed)  01/03/2022    TETANUS/TDAP  02/08/2028   Pneumonia Vaccine 76 Years old  Completed   DEXA SCAN  Completed   Hepatitis C Screening  Completed   HPV VACCINES  Aged Out   COVID-19 Vaccine  Discontinued    Health Maintenance  There are no preventive care reminders to display for this patient.  Colorectal cancer screening: No longer required.   Mammogram status: No longer required due to age .  Bone Density status: Completed 04/05/2016. Results reflect: Bone density results: OSTEOPENIA. Repeat every 5 years.  Lung Cancer Screening: (Low Dose CT Chest recommended if Age 76-80years, 30 pack-year currently smoking OR have quit w/in 15years.) does not qualify.   Lung Cancer Screening Referral: n/a  Additional Screening:  Hepatitis C Screening: does not qualify;   Vision Screening: Recommended annual ophthalmology exams for early detection of glaucoma and other disorders of the eye. Is the patient up to date with their annual eye exam?  Yes  Who is the provider or what is the name of the office in which the patient attends annual eye exams? Dr.Barts  If pt is not established with a provider, would they like to be referred to a provider to establish care? No .   Dental Screening: Recommended annual dental exams for proper oral hygiene  Community Resource Referral / Chronic Care Management: CRR required this visit?  No   CCM required this visit?  No      Plan:     I have personally reviewed and noted the following in the patient's chart:   Medical and social history Use of alcohol, tobacco or illicit drugs  Current medications and supplements including opioid prescriptions.  Functional ability and status Nutritional status Physical activity Advanced directives List of other physicians Hospitalizations, surgeries, and ER visits in previous 12 months Vitals Screenings to include cognitive, depression, and falls Referrals and appointments  In addition, I have reviewed and discussed  with patient certain preventive protocols, quality metrics, and best practice recommendations. A written personalized care plan for preventive services as well as general preventive health recommendations were provided to patient.     LRandel Pigg LPN   [id number]  Nurse Notes: none

## 2021-06-10 NOTE — Patient Instructions (Signed)
[name] , Thank you for taking time to come for your Medicare Wellness Visit. I appreciate your ongoing commitment to your health goals. Please review the following plan we discussed and let me know if I can assist you in the future.   Screening recommendations/referrals: Colonoscopy: no longer required  Mammogram: no longer required  Bone Density: 04/05/2016 Recommended yearly ophthalmology/optometry visit for glaucoma screening and checkup Recommended yearly dental visit for hygiene and checkup  Vaccinations: Influenza vaccine: completed  Pneumococcal vaccine: completed  Tdap vaccine: 02/07/2018 Shingles vaccine: completed     Advanced directives: yes   Conditions/risks identified: none   Next appointment: none    Preventive Care 20 Years and Older, Female Preventive care refers to lifestyle choices and visits with your health care provider that can promote health and wellness. What does preventive care include? A yearly physical exam. This is also called an annual well check. Dental exams once or twice a year. Routine eye exams. Ask your health care provider how often you should have your eyes checked. Personal lifestyle choices, including: Daily care of your teeth and gums. Regular physical activity. Eating a healthy diet. Avoiding tobacco and drug use. Limiting alcohol use. Practicing safe sex. Taking low-dose aspirin every day. Taking vitamin and mineral supplements as recommended by your health care provider. What happens during an annual well check? The services and screenings done by your health care provider during your annual well check will depend on your age, overall health, lifestyle risk factors, and family history of disease. Counseling  Your health care provider may ask you questions about your: Alcohol use. Tobacco use. Drug use. Emotional well-being. Home and relationship well-being. Sexual activity. Eating habits. History of falls. Memory and  ability to understand (cognition). Work and work Statistician. Reproductive health. Screening  You may have the following tests or measurements: Height, weight, and BMI. Blood pressure. Lipid and cholesterol levels. These may be checked every 5 years, or more frequently if you are over 83 years old. Skin check. Lung cancer screening. You may have this screening every year starting at age 79 if you have a 30-pack-year history of smoking and currently smoke or have quit within the past 15 years. Fecal occult blood test (FOBT) of the stool. You may have this test every year starting at age 92. Flexible sigmoidoscopy or colonoscopy. You may have a sigmoidoscopy every 5 years or a colonoscopy every 10 years starting at age 78. Hepatitis C blood test. Hepatitis B blood test. Sexually transmitted disease (STD) testing. Diabetes screening. This is done by checking your blood sugar (glucose) after you have not eaten for a while (fasting). You may have this done every 1-3 years. Bone density scan. This is done to screen for osteoporosis. You may have this done starting at age 68. Mammogram. This may be done every 1-2 years. Talk to your health care provider about how often you should have regular mammograms. Talk with your health care provider about your test results, treatment options, and if necessary, the need for more tests. Vaccines  Your health care provider may recommend certain vaccines, such as: Influenza vaccine. This is recommended every year. Tetanus, diphtheria, and acellular pertussis (Tdap, Td) vaccine. You may need a Td booster every 10 years. Zoster vaccine. You may need this after age 92. Pneumococcal 13-valent conjugate (PCV13) vaccine. One dose is recommended after age 39. Pneumococcal polysaccharide (PPSV23) vaccine. One dose is recommended after age 18. Talk to your health care provider about which screenings and vaccines  you need and how often you need them. This information is  not intended to replace advice given to you by your health care provider. Make sure you discuss any questions you have with your health care provider. Document Released: 01/16/2015 Document Revised: 09/09/2015 Document Reviewed: 10/21/2014 Elsevier Interactive Patient Education  2017 [employer] Prevention in the Home Falls can cause injuries. They can happen to people of all ages. There are many things you can do to make your home safe and to help prevent falls. What can I do on the outside of my home? Regularly fix the edges of walkways and driveways and fix any cracks. Remove anything that might make you trip as you walk through a door, such as a raised step or threshold. Trim any bushes or trees on the path to your home. Use bright outdoor lighting. Clear any walking paths of anything that might make someone trip, such as rocks or tools. Regularly check to see if handrails are loose or broken. Make sure that both sides of any steps have handrails. Any raised decks and porches should have guardrails on the edges. Have any leaves, snow, or ice cleared regularly. Use sand or salt on walking paths during winter. Clean up any spills in your garage right away. This includes oil or grease spills. What can I do in the bathroom? Use night lights. Install grab bars by the toilet and in the tub and shower. Do not use towel bars as grab bars. Use non-skid mats or decals in the tub or shower. If you need to sit down in the shower, use a plastic, non-slip stool. Keep the floor dry. Clean up any water that spills on the floor as soon as it happens. Remove soap buildup in the tub or shower regularly. Attach bath mats securely with double-sided non-slip rug tape. Do not have throw rugs and other things on the floor that can make you trip. What can I do in the bedroom? Use night lights. Make sure that you have a light by your bed that is easy to reach. Do not use any sheets or blankets that  are too big for your bed. They should not hang down onto the floor. Have a firm chair that has side arms. You can use this for support while you get dressed. Do not have throw rugs and other things on the floor that can make you trip. What can I do in the kitchen? Clean up any spills right away. Avoid walking on wet floors. Keep items that you use a lot in easy-to-reach places. If you need to reach something above you, use a strong step stool that has a grab bar. Keep electrical cords out of the way. Do not use floor polish or wax that makes floors slippery. If you must use wax, use non-skid floor wax. Do not have throw rugs and other things on the floor that can make you trip. What can I do with my stairs? Do not leave any items on the stairs. Make sure that there are handrails on both sides of the stairs and use them. Fix handrails that are broken or loose. Make sure that handrails are as long as the stairways. Check any carpeting to make sure that it is firmly attached to the stairs. Fix any carpet that is loose or worn. Avoid having throw rugs at the top or bottom of the stairs. If you do have throw rugs, attach them to the floor with carpet tape. Make sure  that you have a light switch at the top of the stairs and the bottom of the stairs. If you do not have them, ask someone to add them for you. What else can I do to help prevent falls? Wear shoes that: Do not have high heels. Have rubber bottoms. Are comfortable and fit you well. Are closed at the toe. Do not wear sandals. If you use a stepladder: Make sure that it is fully opened. Do not climb a closed stepladder. Make sure that both sides of the stepladder are locked into place. Ask someone to hold it for you, if possible. Clearly mark and make sure that you can see: Any grab bars or handrails. First and last steps. Where the edge of each step is. Use tools that help you move around (mobility aids) if they are needed. These  include: Canes. Walkers. Scooters. Crutches. Turn on the lights when you go into a dark area. Replace any light bulbs as soon as they burn out. Set up your furniture so you have a clear path. Avoid moving your furniture around. If any of your floors are uneven, fix them. If there are any pets around you, be aware of where they are. Review your medicines with your doctor. Some medicines can make you feel dizzy. This can increase your chance of falling. Ask your doctor what other things that you can do to help prevent falls. This information is not intended to replace advice given to you by your health care provider. Make sure you discuss any questions you have with your health care provider. Document Released: 10/16/2008 Document Revised: 05/28/2015 Document Reviewed: 01/24/2014 Elsevier Interactive Patient Education  2017 [employer].

## 2021-06-11 DIAGNOSIS — M1711 Unilateral primary osteoarthritis, right knee: Secondary | ICD-10-CM | POA: Diagnosis not present

## 2021-06-11 DIAGNOSIS — M11261 Other chondrocalcinosis, right knee: Secondary | ICD-10-CM | POA: Diagnosis not present

## 2021-06-11 DIAGNOSIS — M25561 Pain in right knee: Secondary | ICD-10-CM | POA: Diagnosis not present

## 2021-06-14 DIAGNOSIS — M25561 Pain in right knee: Secondary | ICD-10-CM | POA: Diagnosis not present

## 2021-06-14 DIAGNOSIS — M11261 Other chondrocalcinosis, right knee: Secondary | ICD-10-CM | POA: Diagnosis not present

## 2021-06-14 DIAGNOSIS — M1711 Unilateral primary osteoarthritis, right knee: Secondary | ICD-10-CM | POA: Diagnosis not present

## 2021-06-18 DIAGNOSIS — M11261 Other chondrocalcinosis, right knee: Secondary | ICD-10-CM | POA: Diagnosis not present

## 2021-06-18 DIAGNOSIS — M1711 Unilateral primary osteoarthritis, right knee: Secondary | ICD-10-CM | POA: Diagnosis not present

## 2021-06-18 DIAGNOSIS — M25561 Pain in right knee: Secondary | ICD-10-CM | POA: Diagnosis not present

## 2021-06-21 DIAGNOSIS — Z96651 Presence of right artificial knee joint: Secondary | ICD-10-CM | POA: Diagnosis not present

## 2021-06-21 DIAGNOSIS — M1711 Unilateral primary osteoarthritis, right knee: Secondary | ICD-10-CM | POA: Diagnosis not present

## 2021-06-21 DIAGNOSIS — G8918 Other acute postprocedural pain: Secondary | ICD-10-CM | POA: Diagnosis not present

## 2021-06-21 HISTORY — PX: REPLACEMENT TOTAL KNEE: SUR1224

## 2021-07-01 DIAGNOSIS — Z96651 Presence of right artificial knee joint: Secondary | ICD-10-CM | POA: Diagnosis not present

## 2021-07-02 DIAGNOSIS — Z471 Aftercare following joint replacement surgery: Secondary | ICD-10-CM | POA: Diagnosis not present

## 2021-07-02 DIAGNOSIS — M1711 Unilateral primary osteoarthritis, right knee: Secondary | ICD-10-CM | POA: Diagnosis not present

## 2021-07-02 DIAGNOSIS — M25561 Pain in right knee: Secondary | ICD-10-CM | POA: Diagnosis not present

## 2021-07-02 DIAGNOSIS — M11261 Other chondrocalcinosis, right knee: Secondary | ICD-10-CM | POA: Diagnosis not present

## 2021-07-05 DIAGNOSIS — Z471 Aftercare following joint replacement surgery: Secondary | ICD-10-CM | POA: Diagnosis not present

## 2021-07-05 DIAGNOSIS — M11261 Other chondrocalcinosis, right knee: Secondary | ICD-10-CM | POA: Diagnosis not present

## 2021-07-05 DIAGNOSIS — M25561 Pain in right knee: Secondary | ICD-10-CM | POA: Diagnosis not present

## 2021-07-05 DIAGNOSIS — M1711 Unilateral primary osteoarthritis, right knee: Secondary | ICD-10-CM | POA: Diagnosis not present

## 2021-07-09 DIAGNOSIS — M25561 Pain in right knee: Secondary | ICD-10-CM | POA: Diagnosis not present

## 2021-07-09 DIAGNOSIS — M11261 Other chondrocalcinosis, right knee: Secondary | ICD-10-CM | POA: Diagnosis not present

## 2021-07-09 DIAGNOSIS — Z471 Aftercare following joint replacement surgery: Secondary | ICD-10-CM | POA: Diagnosis not present

## 2021-07-09 DIAGNOSIS — M1711 Unilateral primary osteoarthritis, right knee: Secondary | ICD-10-CM | POA: Diagnosis not present

## 2021-07-12 DIAGNOSIS — M25561 Pain in right knee: Secondary | ICD-10-CM | POA: Diagnosis not present

## 2021-07-12 DIAGNOSIS — M1711 Unilateral primary osteoarthritis, right knee: Secondary | ICD-10-CM | POA: Diagnosis not present

## 2021-07-12 DIAGNOSIS — Z471 Aftercare following joint replacement surgery: Secondary | ICD-10-CM | POA: Diagnosis not present

## 2021-07-12 DIAGNOSIS — M11261 Other chondrocalcinosis, right knee: Secondary | ICD-10-CM | POA: Diagnosis not present

## 2021-07-16 DIAGNOSIS — Z471 Aftercare following joint replacement surgery: Secondary | ICD-10-CM | POA: Diagnosis not present

## 2021-07-16 DIAGNOSIS — M11261 Other chondrocalcinosis, right knee: Secondary | ICD-10-CM | POA: Diagnosis not present

## 2021-07-16 DIAGNOSIS — M1711 Unilateral primary osteoarthritis, right knee: Secondary | ICD-10-CM | POA: Diagnosis not present

## 2021-07-16 DIAGNOSIS — M25561 Pain in right knee: Secondary | ICD-10-CM | POA: Diagnosis not present

## 2021-07-19 DIAGNOSIS — Z471 Aftercare following joint replacement surgery: Secondary | ICD-10-CM | POA: Diagnosis not present

## 2021-07-19 DIAGNOSIS — M25561 Pain in right knee: Secondary | ICD-10-CM | POA: Diagnosis not present

## 2021-07-19 DIAGNOSIS — M11261 Other chondrocalcinosis, right knee: Secondary | ICD-10-CM | POA: Diagnosis not present

## 2021-07-19 DIAGNOSIS — M1711 Unilateral primary osteoarthritis, right knee: Secondary | ICD-10-CM | POA: Diagnosis not present

## 2021-07-26 DIAGNOSIS — M1711 Unilateral primary osteoarthritis, right knee: Secondary | ICD-10-CM | POA: Diagnosis not present

## 2021-07-26 DIAGNOSIS — M11261 Other chondrocalcinosis, right knee: Secondary | ICD-10-CM | POA: Diagnosis not present

## 2021-07-26 DIAGNOSIS — M25561 Pain in right knee: Secondary | ICD-10-CM | POA: Diagnosis not present

## 2021-07-26 DIAGNOSIS — Z471 Aftercare following joint replacement surgery: Secondary | ICD-10-CM | POA: Diagnosis not present

## 2021-07-30 DIAGNOSIS — M1711 Unilateral primary osteoarthritis, right knee: Secondary | ICD-10-CM | POA: Diagnosis not present

## 2021-07-30 DIAGNOSIS — M25561 Pain in right knee: Secondary | ICD-10-CM | POA: Diagnosis not present

## 2021-07-30 DIAGNOSIS — M11261 Other chondrocalcinosis, right knee: Secondary | ICD-10-CM | POA: Diagnosis not present

## 2021-07-30 DIAGNOSIS — Z471 Aftercare following joint replacement surgery: Secondary | ICD-10-CM | POA: Diagnosis not present

## 2021-08-02 DIAGNOSIS — Z471 Aftercare following joint replacement surgery: Secondary | ICD-10-CM | POA: Diagnosis not present

## 2021-08-02 DIAGNOSIS — M25561 Pain in right knee: Secondary | ICD-10-CM | POA: Diagnosis not present

## 2021-08-02 DIAGNOSIS — M11261 Other chondrocalcinosis, right knee: Secondary | ICD-10-CM | POA: Diagnosis not present

## 2021-08-02 DIAGNOSIS — M1711 Unilateral primary osteoarthritis, right knee: Secondary | ICD-10-CM | POA: Diagnosis not present

## 2021-08-06 DIAGNOSIS — M1711 Unilateral primary osteoarthritis, right knee: Secondary | ICD-10-CM | POA: Diagnosis not present

## 2021-08-06 DIAGNOSIS — M11261 Other chondrocalcinosis, right knee: Secondary | ICD-10-CM | POA: Diagnosis not present

## 2021-08-06 DIAGNOSIS — M25561 Pain in right knee: Secondary | ICD-10-CM | POA: Diagnosis not present

## 2021-08-06 DIAGNOSIS — Z471 Aftercare following joint replacement surgery: Secondary | ICD-10-CM | POA: Diagnosis not present

## 2021-08-09 DIAGNOSIS — Z471 Aftercare following joint replacement surgery: Secondary | ICD-10-CM | POA: Diagnosis not present

## 2021-08-09 DIAGNOSIS — M11261 Other chondrocalcinosis, right knee: Secondary | ICD-10-CM | POA: Diagnosis not present

## 2021-08-09 DIAGNOSIS — M25561 Pain in right knee: Secondary | ICD-10-CM | POA: Diagnosis not present

## 2021-08-09 DIAGNOSIS — M1711 Unilateral primary osteoarthritis, right knee: Secondary | ICD-10-CM | POA: Diagnosis not present

## 2021-08-13 DIAGNOSIS — M1711 Unilateral primary osteoarthritis, right knee: Secondary | ICD-10-CM | POA: Diagnosis not present

## 2021-08-13 DIAGNOSIS — M25561 Pain in right knee: Secondary | ICD-10-CM | POA: Diagnosis not present

## 2021-08-13 DIAGNOSIS — M11261 Other chondrocalcinosis, right knee: Secondary | ICD-10-CM | POA: Diagnosis not present

## 2021-08-13 DIAGNOSIS — Z471 Aftercare following joint replacement surgery: Secondary | ICD-10-CM | POA: Diagnosis not present

## 2021-08-16 DIAGNOSIS — M25561 Pain in right knee: Secondary | ICD-10-CM | POA: Diagnosis not present

## 2021-08-16 DIAGNOSIS — M11261 Other chondrocalcinosis, right knee: Secondary | ICD-10-CM | POA: Diagnosis not present

## 2021-08-16 DIAGNOSIS — M1711 Unilateral primary osteoarthritis, right knee: Secondary | ICD-10-CM | POA: Diagnosis not present

## 2021-08-16 DIAGNOSIS — Z471 Aftercare following joint replacement surgery: Secondary | ICD-10-CM | POA: Diagnosis not present

## 2021-08-20 DIAGNOSIS — M1711 Unilateral primary osteoarthritis, right knee: Secondary | ICD-10-CM | POA: Diagnosis not present

## 2021-08-20 DIAGNOSIS — M11261 Other chondrocalcinosis, right knee: Secondary | ICD-10-CM | POA: Diagnosis not present

## 2021-08-20 DIAGNOSIS — Z471 Aftercare following joint replacement surgery: Secondary | ICD-10-CM | POA: Diagnosis not present

## 2021-08-20 DIAGNOSIS — M25561 Pain in right knee: Secondary | ICD-10-CM | POA: Diagnosis not present

## 2021-08-23 DIAGNOSIS — M1711 Unilateral primary osteoarthritis, right knee: Secondary | ICD-10-CM | POA: Diagnosis not present

## 2021-08-23 DIAGNOSIS — M25561 Pain in right knee: Secondary | ICD-10-CM | POA: Diagnosis not present

## 2021-08-23 DIAGNOSIS — Z471 Aftercare following joint replacement surgery: Secondary | ICD-10-CM | POA: Diagnosis not present

## 2021-08-23 DIAGNOSIS — M11261 Other chondrocalcinosis, right knee: Secondary | ICD-10-CM | POA: Diagnosis not present

## 2021-08-27 DIAGNOSIS — M11261 Other chondrocalcinosis, right knee: Secondary | ICD-10-CM | POA: Diagnosis not present

## 2021-08-27 DIAGNOSIS — Z471 Aftercare following joint replacement surgery: Secondary | ICD-10-CM | POA: Diagnosis not present

## 2021-08-27 DIAGNOSIS — M1711 Unilateral primary osteoarthritis, right knee: Secondary | ICD-10-CM | POA: Diagnosis not present

## 2021-08-27 DIAGNOSIS — M25561 Pain in right knee: Secondary | ICD-10-CM | POA: Diagnosis not present

## 2021-08-30 DIAGNOSIS — M1711 Unilateral primary osteoarthritis, right knee: Secondary | ICD-10-CM | POA: Diagnosis not present

## 2021-08-30 DIAGNOSIS — M11261 Other chondrocalcinosis, right knee: Secondary | ICD-10-CM | POA: Diagnosis not present

## 2021-08-30 DIAGNOSIS — M25561 Pain in right knee: Secondary | ICD-10-CM | POA: Diagnosis not present

## 2021-08-30 DIAGNOSIS — Z471 Aftercare following joint replacement surgery: Secondary | ICD-10-CM | POA: Diagnosis not present

## 2021-09-03 DIAGNOSIS — M11261 Other chondrocalcinosis, right knee: Secondary | ICD-10-CM | POA: Diagnosis not present

## 2021-09-03 DIAGNOSIS — Z471 Aftercare following joint replacement surgery: Secondary | ICD-10-CM | POA: Diagnosis not present

## 2021-09-03 DIAGNOSIS — M1711 Unilateral primary osteoarthritis, right knee: Secondary | ICD-10-CM | POA: Diagnosis not present

## 2021-09-03 DIAGNOSIS — M25561 Pain in right knee: Secondary | ICD-10-CM | POA: Diagnosis not present

## 2021-09-07 DIAGNOSIS — M25561 Pain in right knee: Secondary | ICD-10-CM | POA: Diagnosis not present

## 2021-09-07 DIAGNOSIS — M11261 Other chondrocalcinosis, right knee: Secondary | ICD-10-CM | POA: Diagnosis not present

## 2021-09-07 DIAGNOSIS — Z471 Aftercare following joint replacement surgery: Secondary | ICD-10-CM | POA: Diagnosis not present

## 2021-09-07 DIAGNOSIS — M1711 Unilateral primary osteoarthritis, right knee: Secondary | ICD-10-CM | POA: Diagnosis not present

## 2021-09-10 DIAGNOSIS — Z471 Aftercare following joint replacement surgery: Secondary | ICD-10-CM | POA: Diagnosis not present

## 2021-09-10 DIAGNOSIS — M1711 Unilateral primary osteoarthritis, right knee: Secondary | ICD-10-CM | POA: Diagnosis not present

## 2021-09-10 DIAGNOSIS — M25561 Pain in right knee: Secondary | ICD-10-CM | POA: Diagnosis not present

## 2021-09-10 DIAGNOSIS — M11261 Other chondrocalcinosis, right knee: Secondary | ICD-10-CM | POA: Diagnosis not present

## 2021-09-20 DIAGNOSIS — M1711 Unilateral primary osteoarthritis, right knee: Secondary | ICD-10-CM | POA: Diagnosis not present

## 2021-09-20 DIAGNOSIS — Z471 Aftercare following joint replacement surgery: Secondary | ICD-10-CM | POA: Diagnosis not present

## 2021-09-20 DIAGNOSIS — M11261 Other chondrocalcinosis, right knee: Secondary | ICD-10-CM | POA: Diagnosis not present

## 2021-09-20 DIAGNOSIS — M25561 Pain in right knee: Secondary | ICD-10-CM | POA: Diagnosis not present

## 2021-09-24 DIAGNOSIS — Z471 Aftercare following joint replacement surgery: Secondary | ICD-10-CM | POA: Diagnosis not present

## 2021-09-24 DIAGNOSIS — M1711 Unilateral primary osteoarthritis, right knee: Secondary | ICD-10-CM | POA: Diagnosis not present

## 2021-09-24 DIAGNOSIS — M11261 Other chondrocalcinosis, right knee: Secondary | ICD-10-CM | POA: Diagnosis not present

## 2021-09-24 DIAGNOSIS — M25561 Pain in right knee: Secondary | ICD-10-CM | POA: Diagnosis not present

## 2021-09-27 DIAGNOSIS — M25561 Pain in right knee: Secondary | ICD-10-CM | POA: Diagnosis not present

## 2021-09-27 DIAGNOSIS — M1711 Unilateral primary osteoarthritis, right knee: Secondary | ICD-10-CM | POA: Diagnosis not present

## 2021-09-27 DIAGNOSIS — M11261 Other chondrocalcinosis, right knee: Secondary | ICD-10-CM | POA: Diagnosis not present

## 2021-09-27 DIAGNOSIS — Z471 Aftercare following joint replacement surgery: Secondary | ICD-10-CM | POA: Diagnosis not present

## 2021-09-30 DIAGNOSIS — Z471 Aftercare following joint replacement surgery: Secondary | ICD-10-CM | POA: Diagnosis not present

## 2021-09-30 DIAGNOSIS — M11261 Other chondrocalcinosis, right knee: Secondary | ICD-10-CM | POA: Diagnosis not present

## 2021-09-30 DIAGNOSIS — M25561 Pain in right knee: Secondary | ICD-10-CM | POA: Diagnosis not present

## 2021-09-30 DIAGNOSIS — M1711 Unilateral primary osteoarthritis, right knee: Secondary | ICD-10-CM | POA: Diagnosis not present

## 2021-10-04 DIAGNOSIS — Z471 Aftercare following joint replacement surgery: Secondary | ICD-10-CM | POA: Diagnosis not present

## 2021-10-04 DIAGNOSIS — M11261 Other chondrocalcinosis, right knee: Secondary | ICD-10-CM | POA: Diagnosis not present

## 2021-10-04 DIAGNOSIS — M1711 Unilateral primary osteoarthritis, right knee: Secondary | ICD-10-CM | POA: Diagnosis not present

## 2021-10-04 DIAGNOSIS — M25561 Pain in right knee: Secondary | ICD-10-CM | POA: Diagnosis not present

## 2021-10-08 DIAGNOSIS — M25561 Pain in right knee: Secondary | ICD-10-CM | POA: Diagnosis not present

## 2021-10-08 DIAGNOSIS — M11261 Other chondrocalcinosis, right knee: Secondary | ICD-10-CM | POA: Diagnosis not present

## 2021-10-08 DIAGNOSIS — M1711 Unilateral primary osteoarthritis, right knee: Secondary | ICD-10-CM | POA: Diagnosis not present

## 2021-10-08 DIAGNOSIS — Z471 Aftercare following joint replacement surgery: Secondary | ICD-10-CM | POA: Diagnosis not present

## 2021-10-11 DIAGNOSIS — M11261 Other chondrocalcinosis, right knee: Secondary | ICD-10-CM | POA: Diagnosis not present

## 2021-10-11 DIAGNOSIS — M25561 Pain in right knee: Secondary | ICD-10-CM | POA: Diagnosis not present

## 2021-10-11 DIAGNOSIS — M1711 Unilateral primary osteoarthritis, right knee: Secondary | ICD-10-CM | POA: Diagnosis not present

## 2021-10-11 DIAGNOSIS — Z471 Aftercare following joint replacement surgery: Secondary | ICD-10-CM | POA: Diagnosis not present

## 2021-10-15 DIAGNOSIS — M1711 Unilateral primary osteoarthritis, right knee: Secondary | ICD-10-CM | POA: Diagnosis not present

## 2021-10-15 DIAGNOSIS — M11261 Other chondrocalcinosis, right knee: Secondary | ICD-10-CM | POA: Diagnosis not present

## 2021-10-15 DIAGNOSIS — Z471 Aftercare following joint replacement surgery: Secondary | ICD-10-CM | POA: Diagnosis not present

## 2021-10-15 DIAGNOSIS — M25561 Pain in right knee: Secondary | ICD-10-CM | POA: Diagnosis not present

## 2021-10-18 DIAGNOSIS — Z471 Aftercare following joint replacement surgery: Secondary | ICD-10-CM | POA: Diagnosis not present

## 2021-10-18 DIAGNOSIS — M11261 Other chondrocalcinosis, right knee: Secondary | ICD-10-CM | POA: Diagnosis not present

## 2021-10-18 DIAGNOSIS — M1711 Unilateral primary osteoarthritis, right knee: Secondary | ICD-10-CM | POA: Diagnosis not present

## 2021-10-18 DIAGNOSIS — M25561 Pain in right knee: Secondary | ICD-10-CM | POA: Diagnosis not present

## 2021-10-22 DIAGNOSIS — M25561 Pain in right knee: Secondary | ICD-10-CM | POA: Diagnosis not present

## 2021-10-22 DIAGNOSIS — M11261 Other chondrocalcinosis, right knee: Secondary | ICD-10-CM | POA: Diagnosis not present

## 2021-10-22 DIAGNOSIS — Z471 Aftercare following joint replacement surgery: Secondary | ICD-10-CM | POA: Diagnosis not present

## 2021-10-22 DIAGNOSIS — M1711 Unilateral primary osteoarthritis, right knee: Secondary | ICD-10-CM | POA: Diagnosis not present

## 2021-10-25 DIAGNOSIS — Z471 Aftercare following joint replacement surgery: Secondary | ICD-10-CM | POA: Diagnosis not present

## 2021-10-25 DIAGNOSIS — M1711 Unilateral primary osteoarthritis, right knee: Secondary | ICD-10-CM | POA: Diagnosis not present

## 2021-10-25 DIAGNOSIS — M25561 Pain in right knee: Secondary | ICD-10-CM | POA: Diagnosis not present

## 2021-10-25 DIAGNOSIS — M11261 Other chondrocalcinosis, right knee: Secondary | ICD-10-CM | POA: Diagnosis not present

## 2021-10-29 DIAGNOSIS — Z471 Aftercare following joint replacement surgery: Secondary | ICD-10-CM | POA: Diagnosis not present

## 2021-10-29 DIAGNOSIS — M11261 Other chondrocalcinosis, right knee: Secondary | ICD-10-CM | POA: Diagnosis not present

## 2021-10-29 DIAGNOSIS — M25561 Pain in right knee: Secondary | ICD-10-CM | POA: Diagnosis not present

## 2021-10-29 DIAGNOSIS — M1711 Unilateral primary osteoarthritis, right knee: Secondary | ICD-10-CM | POA: Diagnosis not present

## 2021-11-01 DIAGNOSIS — M25561 Pain in right knee: Secondary | ICD-10-CM | POA: Diagnosis not present

## 2021-11-01 DIAGNOSIS — M11261 Other chondrocalcinosis, right knee: Secondary | ICD-10-CM | POA: Diagnosis not present

## 2021-11-01 DIAGNOSIS — M1711 Unilateral primary osteoarthritis, right knee: Secondary | ICD-10-CM | POA: Diagnosis not present

## 2021-11-01 DIAGNOSIS — Z471 Aftercare following joint replacement surgery: Secondary | ICD-10-CM | POA: Diagnosis not present

## 2021-12-09 DIAGNOSIS — M25461 Effusion, right knee: Secondary | ICD-10-CM | POA: Diagnosis not present

## 2021-12-09 DIAGNOSIS — Z96651 Presence of right artificial knee joint: Secondary | ICD-10-CM | POA: Diagnosis not present

## 2022-01-06 ENCOUNTER — Encounter: Payer: Self-pay | Admitting: Family Medicine

## 2022-01-06 NOTE — Telephone Encounter (Signed)
Spoke w/ pt and advised her to use a warm compress every 10 to 15 mins . If no better by in the am she is to call the office

## 2022-01-10 ENCOUNTER — Encounter: Payer: Self-pay | Admitting: Family Medicine

## 2022-01-10 ENCOUNTER — Ambulatory Visit (INDEPENDENT_AMBULATORY_CARE_PROVIDER_SITE_OTHER): Payer: PPO | Admitting: Family Medicine

## 2022-01-10 VITALS — BP 118/60 | HR 84 | Temp 98.7°F | Resp 16 | Ht 62.0 in | Wt 108.1 lb

## 2022-01-10 DIAGNOSIS — L02213 Cutaneous abscess of chest wall: Secondary | ICD-10-CM

## 2022-01-10 MED ORDER — DOXYCYCLINE HYCLATE 100 MG PO TABS: 100.0000 mg | ORAL_TABLET | Freq: Two times a day (BID) | ORAL | 0 refills | Status: DC

## 2022-01-10 NOTE — Patient Instructions (Signed)
Follow up as needed or as scheduled STAR the Doxycycline twice daily- w/ food Warm compresses to the area multiple times daily Try not to pick or squeeze Call with any questions or concerns- particularly if worsening or not improving Hang in there!!! Happy New Year!!!

## 2022-01-10 NOTE — Progress Notes (Signed)
   Subjective:    Patient ID: Taylor Mclaughlin, female    DOB: 05-15-1945, 77 y.o.   MRN: [id number]  HPI Skin lesion- on chest.  First thought it was an ingrown hair or white head.  Over the last 2 weeks it has worsening.  Initially was able to express pus.  Over the weekend she described it as feeling like a 'stone under the skin'.  Was draining on Thursday/Friday.  No longer draining.  Area is TTP and w/ certain movements.  No fever.   Review of Systems For ROS see HPI     Objective:   Physical Exam Vitals reviewed.  Constitutional:      General: She is not in acute distress.    Appearance: Normal appearance. She is not ill-appearing.  HENT:     Head: Normocephalic and atraumatic.  Eyes:     Extraocular Movements: Extraocular movements intact.     Conjunctiva/sclera: Conjunctivae normal.     Pupils: Pupils are equal, round, and reactive to light.  Skin:    General: Skin is warm and dry.     Findings: Abscess and erythema present.       Neurological:     General: No focal deficit present.     Mental Status: She is alert and oriented to person, place, and time.           Assessment & Plan:  Abscess- new.  Pt w/ 2 cm area of induration on chest wall just L of center and extending into L breast w/ overlying erythema.  Small visible central pore but no area to drain or express.  Start Doxycycline BID x10 day, encouraged hot compresses, and she is to avoid squeezing or picking.  If no improvement, will need surgery referral.  Pt expressed understanding and is in agreement w/ plan.

## 2022-01-18 NOTE — Telephone Encounter (Signed)
Pt following up 01/10/22 appt with an update on condition and asking what next steps are as she nears end of the Rx

## 2022-01-21 NOTE — Telephone Encounter (Signed)
We have scheduled pt to come in Wed to have it looked at .

## 2022-01-26 ENCOUNTER — Ambulatory Visit (INDEPENDENT_AMBULATORY_CARE_PROVIDER_SITE_OTHER): Payer: PPO | Admitting: Family Medicine

## 2022-01-26 ENCOUNTER — Encounter: Payer: Self-pay | Admitting: Family Medicine

## 2022-01-26 VITALS — BP 122/76 | HR 73 | Temp 98.9°F | Resp 16 | Ht 62.0 in | Wt 110.2 lb

## 2022-01-26 DIAGNOSIS — L02213 Cutaneous abscess of chest wall: Secondary | ICD-10-CM | POA: Diagnosis not present

## 2022-01-26 MED ORDER — DOXYCYCLINE HYCLATE 100 MG PO TABS: 100.0000 mg | ORAL_TABLET | Freq: Two times a day (BID) | ORAL | 0 refills | Status: DC

## 2022-01-26 NOTE — Progress Notes (Signed)
   Subjective:    Patient ID: Taylor Mclaughlin, female    DOB: June 05, 1945, 77 y.o.   MRN: [id number]  HPI Abscess- central chest.  pt reports area is not as large, particularly on the L side that was extending into breast, but the central area is now fluctuant and oozing on the inferior side.  Remains TTP.  She finished Doxycycline and has continued hot compresses.   Review of Systems For ROS see HPI     Objective:   Physical Exam Vitals reviewed.  Constitutional:      General: She is not in acute distress.    Appearance: Normal appearance. She is not ill-appearing.  HENT:     Head: Normocephalic and atraumatic.  Skin:    General: Skin is warm and dry.     Findings: Erythema (overlying 1"x1" abscess of inferior sternum.  area TTP, fluctuant, oozing w/ gentle pressure) present.  Neurological:     General: No focal deficit present.     Mental Status: She is alert and oriented to person, place, and time.  Psychiatric:        Mood and Affect: Mood normal.        Behavior: Behavior normal.        Thought Content: Thought content normal.           Assessment & Plan:  Abscess of chest wall- ongoing.  Area is now fluctuant and able to be drained.  Pt consented to I&D.  Area was prepped w/ alcohol, cold spray was used, and a 15 blade was used to make 3 stab incisions- 1 superiorly and 2 inferiorly.  Minimal drainage from superior incision but copious pus and sebaceous debris expressed from 2 inferior incisions using gentle pressure.  Pt reported instant relief of pain and pressure.  Tolerated procedure w/o difficulty.  Will restart Doxycycline.  Wound care reviewed.  Pt expressed understanding and is in agreement w/ plan.

## 2022-01-26 NOTE — Patient Instructions (Signed)
Follow up via MyChart in 7 days to let me know how things are going RESTART the Doxycycline- twice daily take w/ food Continue the hot compresses You are doing all the right things!  It's just VERY stubborn! Call with any questions or concerns Hang in there!!!

## 2022-06-08 DIAGNOSIS — H35341 Macular cyst, hole, or pseudohole, right eye: Secondary | ICD-10-CM | POA: Diagnosis not present

## 2022-06-08 DIAGNOSIS — H2513 Age-related nuclear cataract, bilateral: Secondary | ICD-10-CM | POA: Diagnosis not present

## 2022-06-16 ENCOUNTER — Ambulatory Visit (INDEPENDENT_AMBULATORY_CARE_PROVIDER_SITE_OTHER): Payer: PPO | Admitting: *Deleted

## 2022-06-16 DIAGNOSIS — Z780 Asymptomatic menopausal state: Secondary | ICD-10-CM | POA: Diagnosis not present

## 2022-06-16 DIAGNOSIS — Z1231 Encounter for screening mammogram for malignant neoplasm of breast: Secondary | ICD-10-CM

## 2022-06-16 DIAGNOSIS — Z0000 Encounter for general adult medical examination without abnormal findings: Secondary | ICD-10-CM | POA: Diagnosis not present

## 2022-06-16 NOTE — Progress Notes (Signed)
Subjective:   Taylor Mclaughlin is a 77 y.o. female who presents for Medicare Annual (Subsequent) preventive examination.  I connected with  Taylor Mclaughlin on 06/16/22 by a telephone enabled telemedicine application and verified that I am speaking with the correct person using two identifiers.   I discussed the limitations of evaluation and management by telemedicine. The patient expressed understanding and agreed to proceed.  Patient location: home  Provider location: telephone/home    Review of Systems       Cardiac Risk Factors include: advanced age (>26men, >67 women)     Objective:    Today's Vitals   06/16/22 1308  PainSc: 8    There is no height or weight on file to calculate BMI.     06/16/2022    1:10 PM 06/10/2021    2:08 PM 09/27/2020    7:12 PM 09/27/2020    7:05 PM 02/07/2018    8:34 AM 02/01/2017    2:33 PM 12/30/2013    1:00 PM  Advanced Directives  Does Patient Have a Medical Advance Directive? Yes Yes No No Yes Yes Yes  Type of Sales promotion account executive of [place];Living will   Healthcare Power of [place];Living will Living will;Healthcare Power of [employer] Power of [place];Living will  Does patient want to make changes to medical advance directive?       No - Patient declined  Copy of Healthcare Power of Attorney in Chart? No - copy requested No - copy requested   No - copy requested No - copy requested Yes    Current Medications (verified) Outpatient Encounter Medications as of 06/16/2022  Medication Sig   Calcium Carbonate-Vitamin D 500-125 MG-UNIT TABS Take by mouth.   Glucosamine-Chondroitin 500-400 MG CAPS Take 500 mg by mouth 1 day or 1 dose.   Multiple Vitamins-Minerals (MULTIVITAMIN ADULT PO) Take 500 mg by mouth daily.   TURMERIC PO Take 500 mg by mouth daily.    doxycycline (VIBRA-TABS) 100 MG tablet Take 1 tablet (100 mg total) by mouth 2 (two) times daily.   Ferrous Sulfate (IRON) 142 (45 Fe) MG  TBCR Take 142 mg by mouth daily.   Ferrous Sulfate Dried (SLOW RELEASE IRON) 45 MG TBCR Take by mouth.   No facility-administered encounter medications on file as of 06/16/2022.    Allergies (verified) Patient has no known allergies.   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   ROBOTIC ASSISTED LAPAROSCOPIC HYSTERECTOMY AND SALPINGECTOMY N/A 12/30/2013   Procedure: ROBOTIC ASSISTED LAPAROSCOPIC HYSTERECTOMY AND SACROCOLPOPEXY;  Surgeon: Crist Fat, MD;  Location: [facility];  Service: Urology;  Laterality: N/A;   TONSILLECTOMY  1952   TUBAL LIGATION  1975   WISDOM TOOTH EXTRACTION  1993   Family History  Problem Relation Age of Onset   Hyperlipidemia Mother    Parkinson's disease Father    Macular degeneration Brother    Depression Daughter    Depression Son    Social History   Socioeconomic History   Marital status: Married  Spouse name: Not on file   Number of children: Not on file   Years of education: Not on file   Highest education level: Not on file  Occupational History   Not on file  Tobacco Use   Smoking status: Never   Smokeless tobacco: Never  Substance and Sexual Activity   Alcohol use: Yes    Comment: OCCASIONAL   Drug use: No   Sexual activity: Not Currently  Other Topics Concern   Not on file  Social History Narrative   Not on file   Social Determinants of Health   Financial Resource Strain: Low Risk  (06/16/2022)   Overall Financial Resource Strain (CARDIA)    Difficulty of Paying Living Expenses: Not hard at all  Food Insecurity: No Food Insecurity (06/16/2022)   Hunger  Vital Sign    Worried About Running Out of Food in the Last Year: Never true    Ran Out of Food in the Last Year: Never true  Transportation Needs: No Transportation Needs (06/16/2022)   PRAPARE - Administrator, Civil Service (Medical): No    Lack of Transportation (Non-Medical): No  Physical Activity: Insufficiently Active (06/16/2022)   Exercise Vital Sign    Days of Exercise per Week: 3 days    Minutes of Exercise per Session: 30 min  Stress: No Stress Concern Present (06/16/2022)   [employer] of Occupational Health - Occupational Stress Questionnaire    Feeling of Stress : Not at all  Social Connections: Moderately Integrated (06/16/2022)   Social Connection and Isolation Panel [NHANES]    Frequency of Communication with Friends and Family: More than three times a week    Frequency of Social Gatherings with Friends and Family: Three times a week    Attends Religious Services: Never    Active Member of Clubs or Organizations: Yes    Attends Engineer, structural: More than 4 times per year    Marital Status: Married    Tobacco Counseling Counseling given: Not Answered   Clinical Intake:  Pre-visit preparation completed: Yes  Pain : 0-10 Pain Score: 8  Pain Type: Chronic pain Pain Location: Back Pain Descriptors / Indicators: Burning, Aching, Constant Pain Onset: More than a month ago Pain Frequency: Intermittent     Diabetes: No  How often do you need to have someone help you when you read instructions, pamphlets, or other written materials from your doctor or pharmacy?: 1 - Never  Diabetic?  no  Interpreter Needed?: No  Information entered by :: Remi Haggard LPN   Activities of Daily Living    06/16/2022    1:04 PM 06/10/2022    9:47 AM  In your present state of health, do you have any difficulty performing the following activities:  Hearing? 1   Comment bilateral heaing aids   Vision? 0 0  Difficulty concentrating or making  decisions? 0   Walking or climbing stairs? 0   Dressing or bathing? 0 0  Doing errands, shopping? 0 0  Preparing Food and eating ? N N  Using the Toilet? N N  In the past six months, have you accidently leaked urine? Y Y  Do you have problems with loss of bowel control? N N  Managing your Medications? N N  Managing your Finances? N N  Housekeeping or managing your Housekeeping? N N    Patient Care Team: Sheliah Hatch, MD as PCP - General (Family Medicine)  Indicate any recent Medical Services you may  have received from other than [facility] providers in the past year (date may be approximate).     Assessment:   This is a routine wellness examination for Anaeli.  Hearing/Vision screen Hearing Screening - Comments:: Bilateral hearing aids Vision Screening - Comments:: Up to date [place]  Dietary issues and exercise activities discussed: Current Exercise Habits: Home exercise routine (physical Therapy), Type of exercise: strength training/weights;stretching, Time (Minutes): 30, Frequency (Times/Week): 3, Weekly Exercise (Minutes/Week): 90, Intensity: Mild, Exercise limited by: orthopedic condition(s)   Goals Addressed             This Visit's Progress    Patient Stated       Would like to finish of projects       Depression Screen    06/16/2022    1:14 PM 01/26/2022   10:30 AM 01/10/2022    9:42 AM 06/10/2021    2:08 PM 06/10/2021    2:05 PM 05/12/2021    7:57 AM 09/09/2020   10:43 AM  PHQ 2/9 Scores  PHQ - 2 Score 0 0 0 0 0 0 0  PHQ- 9 Score 2 1 1   2  0    Fall Risk    06/16/2022    1:10 PM 06/10/2022    9:47 AM 01/26/2022   10:31 AM 01/10/2022    9:42 AM 06/10/2021    2:08 PM  Fall Risk   Falls in the past year? 0 0 0 0 0  Number falls in past yr: 0  0 0 0  Injury with Fall? 0  0 0 0  Risk for fall due to :   No Fall Risks No Fall Risks   Follow up Falls evaluation completed;Education provided;Falls prevention discussed  Falls evaluation completed Falls evaluation  completed Falls evaluation completed    FALL RISK PREVENTION PERTAINING TO THE HOME:  Any stairs in or around the home? Yes  If so, are there any without handrails? No  Home free of loose throw rugs in walkways, pet beds, electrical cords, etc? Yes  Adequate lighting in your home to reduce risk of falls? Yes   ASSISTIVE DEVICES UTILIZED TO PREVENT FALLS:  Life alert? No  Use of a cane, walker or w/c? No  Grab bars in the bathroom? Yes  Shower chair or bench in shower? No  Elevated toilet seat or a handicapped toilet? Yes   TIMED UP AND GO:  Was the test performed? No .    Cognitive Function:    02/07/2018    8:38 AM 02/01/2017    2:37 PM  MMSE - Mini Mental State Exam  Orientation to time 5 5  Orientation to Place 5 5  Registration 3 3  Attention/ Calculation 5 5  Recall 3 3  Language- name 2 objects 2 2  Language- repeat 1 1  Language- follow 3 step command 3 3  Language- read & follow direction 1 1  Write a sentence 1 1  Copy design 1 1  Total score 30 30        06/16/2022    1:11 PM  6CIT Screen  What Year? 0 points  What month? 0 points  What time? 0 points  Count back from 20 0 points  Months in reverse 0 points  Repeat phrase 0 points  Total Score 0 points    Immunizations Immunization History  Administered Date(s) Administered   Fluad Quad(high Dose 65+) 10/10/2018, 10/09/2019   Influenza, High Dose Seasonal PF 10/09/2015, 10/18/2017   Influenza,inj,Quad  PF,6+ Mos 10/04/2016   Influenza-Unspecified 09/01/2021   PFIZER(Purple Top)SARS-COV-2 Vaccination 02/09/2019, 03/06/2019, 10/18/2019, 08/03/2020   Pneumococcal Conjugate-13 08/20/2013   Pneumococcal Polysaccharide-23 04/08/2009, 02/26/2016   Tdap 07/04/2006, 02/07/2018   Zoster Recombinat (Shingrix) 05/16/2017   Zoster, Live 04/20/2009    TDAP status: Up to date  Flu Vaccine status: Up to date  Pneumococcal vaccine status: Up to date  Covid-19 vaccine status: Information provided on how  to obtain vaccines.   Qualifies for Shingles Vaccine? Yes   Zostavax completed Yes   Shingrix Completed?: No.    Education has been provided regarding the importance of this vaccine. Patient has been advised to call insurance company to determine out of pocket expense if they have not yet received this vaccine. Advised may also receive vaccine at local pharmacy or Health Dept. Verbalized acceptance and understanding.  Screening Tests Health Maintenance  Topic Date Due   INFLUENZA VACCINE  08/04/2022   Medicare Annual Wellness (AWV)  06/16/2023   DTaP/Tdap/Td (3 - Td or Tdap) 02/08/2028   Pneumonia Vaccine 33+ Years old  Completed   DEXA SCAN  Completed   Hepatitis C Screening  Completed   HPV VACCINES  Aged Out   Colonoscopy  Discontinued   COVID-19 Vaccine  Discontinued   Zoster Vaccines- Shingrix  Discontinued    Health Maintenance  There are no preventive care reminders to display for this patient.   Colorectal cancer screening: Type of screening: Colonoscopy. Completed 2014. Repeat every 10 years  Mammogram status: Ordered age. Pt provided with contact info and advised to call to schedule appt.   Bone Density status: Completed 2018. Results reflect: Bone density results: OSTEOPOROSIS. Repeat every 2 years.    ORDERED  Lung Cancer Screening: (Low Dose CT Chest recommended if Age 46-80 years, 30 pack-year currently smoking OR have quit w/in 15years.) does not qualify.   Lung Cancer Screening Referral:   Additional Screening:  Hepatitis C Screening: does not qualify; Completed 2019  Vision Screening: Recommended annual ophthalmology exams for early detection of glaucoma and other disorders of the eye. Is the patient up to date with their annual eye exam?  Yes  Who is the provider or what is the name of the office in which the patient attends annual eye exams? [facility] If pt is not established with a provider, would they like to be referred to a provider to establish care? No  .   Dental Screening: Recommended annual dental exams for proper oral hygiene  Community Resource Referral / Chronic Care Management: CRR required this visit?  No   CCM required this visit?  No      Plan:     I have personally reviewed and noted the following in the patient's chart:   Medical and social history Use of alcohol, tobacco or illicit drugs  Current medications and supplements including opioid prescriptions. Patient is not currently taking opioid prescriptions. Functional ability and status Nutritional status Physical activity Advanced directives List of other physicians Hospitalizations, surgeries, and ER visits in previous 12 months Vitals Screenings to include cognitive, depression, and falls Referrals and appointments  In addition, I have reviewed and discussed with patient certain preventive protocols, quality metrics, and best practice recommendations. A written personalized care plan for preventive services as well as general preventive health recommendations were provided to patient.     Remi Haggard, LPN   1/61/0960   Nurse Notes:

## 2022-06-16 NOTE — Patient Instructions (Signed)
[name] , Thank you for taking time to come for your Medicare Wellness Visit. I appreciate your ongoing commitment to your health goals. Please review the following plan we discussed and let me know if I can assist you in the future.   Screening recommendations/referrals: Colonoscopy: no longer required Mammogram: Education provided Bone Density: Education provided Recommended yearly ophthalmology/optometry visit for glaucoma screening and checkup Recommended yearly dental visit for hygiene and checkup  Vaccinations: Influenza vaccine: up to date Pneumococcal vaccine: up to date Tdap vaccine: up to date Shingles vaccine: 1 of 2    Advanced directives: Education provided    Preventive Care 65 Years and Older, Female Preventive care refers to lifestyle choices and visits with your health care provider that can promote health and wellness. What does preventive care include? A yearly physical exam. This is also called an annual well check. Dental exams once or twice a year. Routine eye exams. Ask your health care provider how often you should have your eyes checked. Personal lifestyle choices, including: Daily care of your teeth and gums. Regular physical activity. Eating a healthy diet. Avoiding tobacco and drug use. Limiting alcohol use. Practicing safe sex. Taking low-dose aspirin every day. Taking vitamin and mineral supplements as recommended by your health care provider. What happens during an annual well check? The services and screenings done by your health care provider during your annual well check will depend on your age, overall health, lifestyle risk factors, and family history of disease. Counseling  Your health care provider may ask you questions about your: Alcohol use. Tobacco use. Drug use. Emotional well-being. Home and relationship well-being. Sexual activity. Eating habits. History of falls. Memory and ability to understand (cognition). Work and work  Astronomer. Reproductive health. Screening  You may have the following tests or measurements: Height, weight, and BMI. Blood pressure. Lipid and cholesterol levels. These may be checked every 5 years, or more frequently if you are over 104 years old. Skin check. Lung cancer screening. You may have this screening every year starting at age 74 if you have a 30-pack-year history of smoking and currently smoke or have quit within the past 15 years. Fecal occult blood test (FOBT) of the stool. You may have this test every year starting at age 3. Flexible sigmoidoscopy or colonoscopy. You may have a sigmoidoscopy every 5 years or a colonoscopy every 10 years starting at age 33. Hepatitis C blood test. Hepatitis B blood test. Sexually transmitted disease (STD) testing. Diabetes screening. This is done by checking your blood sugar (glucose) after you have not eaten for a while (fasting). You may have this done every 1-3 years. Bone density scan. This is done to screen for osteoporosis. You may have this done starting at age 67. Mammogram. This may be done every 1-2 years. Talk to your health care provider about how often you should have regular mammograms. Talk with your health care provider about your test results, treatment options, and if necessary, the need for more tests. Vaccines  Your health care provider may recommend certain vaccines, such as: Influenza vaccine. This is recommended every year. Tetanus, diphtheria, and acellular pertussis (Tdap, Td) vaccine. You may need a Td booster every 10 years. Zoster vaccine. You may need this after age 59. Pneumococcal 13-valent conjugate (PCV13) vaccine. One dose is recommended after age 61. Pneumococcal polysaccharide (PPSV23) vaccine. One dose is recommended after age 32. Talk to your health care provider about which screenings and vaccines you need and how often you  need them. This information is not intended to replace advice given to you by  your health care provider. Make sure you discuss any questions you have with your health care provider. Document Released: 01/16/2015 Document Revised: 09/09/2015 Document Reviewed: 10/21/2014 Elsevier Interactive Patient Education  2017 [place] Prevention in the Home Falls can cause injuries. They can happen to people of all ages. There are many things you can do to make your home safe and to help prevent falls. What can I do on the outside of my home? Regularly fix the edges of walkways and driveways and fix any cracks. Remove anything that might make you trip as you walk through a door, such as a raised step or threshold. Trim any bushes or trees on the path to your home. Use bright outdoor lighting. Clear any walking paths of anything that might make someone trip, such as rocks or tools. Regularly check to see if handrails are loose or broken. Make sure that both sides of any steps have handrails. Any raised decks and porches should have guardrails on the edges. Have any leaves, snow, or ice cleared regularly. Use sand or salt on walking paths during winter. Clean up any spills in your garage right away. This includes oil or grease spills. What can I do in the bathroom? Use night lights. Install grab bars by the toilet and in the tub and shower. Do not use towel bars as grab bars. Use non-skid mats or decals in the tub or shower. If you need to sit down in the shower, use a plastic, non-slip stool. Keep the floor dry. Clean up any water that spills on the floor as soon as it happens. Remove soap buildup in the tub or shower regularly. Attach bath mats securely with double-sided non-slip rug tape. Do not have throw rugs and other things on the floor that can make you trip. What can I do in the bedroom? Use night lights. Make sure that you have a light by your bed that is easy to reach. Do not use any sheets or blankets that are too big for your bed. They should not hang  down onto the floor. Have a firm chair that has side arms. You can use this for support while you get dressed. Do not have throw rugs and other things on the floor that can make you trip. What can I do in the kitchen? Clean up any spills right away. Avoid walking on wet floors. Keep items that you use a lot in easy-to-reach places. If you need to reach something above you, use a strong step stool that has a grab bar. Keep electrical cords out of the way. Do not use floor polish or wax that makes floors slippery. If you must use wax, use non-skid floor wax. Do not have throw rugs and other things on the floor that can make you trip. What can I do with my stairs? Do not leave any items on the stairs. Make sure that there are handrails on both sides of the stairs and use them. Fix handrails that are broken or loose. Make sure that handrails are as long as the stairways. Check any carpeting to make sure that it is firmly attached to the stairs. Fix any carpet that is loose or worn. Avoid having throw rugs at the top or bottom of the stairs. If you do have throw rugs, attach them to the floor with carpet tape. Make sure that you have a light switch  at the top of the stairs and the bottom of the stairs. If you do not have them, ask someone to add them for you. What else can I do to help prevent falls? Wear shoes that: Do not have high heels. Have rubber bottoms. Are comfortable and fit you well. Are closed at the toe. Do not wear sandals. If you use a stepladder: Make sure that it is fully opened. Do not climb a closed stepladder. Make sure that both sides of the stepladder are locked into place. Ask someone to hold it for you, if possible. Clearly mark and make sure that you can see: Any grab bars or handrails. First and last steps. Where the edge of each step is. Use tools that help you move around (mobility aids) if they are needed. These  include: Canes. Walkers. Scooters. Crutches. Turn on the lights when you go into a dark area. Replace any light bulbs as soon as they burn out. Set up your furniture so you have a clear path. Avoid moving your furniture around. If any of your floors are uneven, fix them. If there are any pets around you, be aware of where they are. Review your medicines with your doctor. Some medicines can make you feel dizzy. This can increase your chance of falling. Ask your doctor what other things that you can do to help prevent falls. This information is not intended to replace advice given to you by your health care provider. Make sure you discuss any questions you have with your health care provider. Document Released: 10/16/2008 Document Revised: 05/28/2015 Document Reviewed: 01/24/2014 Elsevier Interactive Patient Education  2017 [employer].

## 2022-06-20 DIAGNOSIS — H2511 Age-related nuclear cataract, right eye: Secondary | ICD-10-CM | POA: Diagnosis not present

## 2022-06-20 DIAGNOSIS — H2513 Age-related nuclear cataract, bilateral: Secondary | ICD-10-CM | POA: Diagnosis not present

## 2022-07-01 DIAGNOSIS — Z0489 Encounter for examination and observation for other specified reasons: Secondary | ICD-10-CM | POA: Diagnosis not present

## 2022-07-01 DIAGNOSIS — Z96651 Presence of right artificial knee joint: Secondary | ICD-10-CM | POA: Diagnosis not present

## 2022-08-02 ENCOUNTER — Ambulatory Visit
Admission: RE | Admit: 2022-08-02 | Discharge: 2022-08-02 | Disposition: A | Discharge status: Home / Self Care | Payer: PPO | Source: Ambulatory Visit | Attending: Family Medicine | Admitting: Family Medicine

## 2022-08-02 DIAGNOSIS — Z1231 Encounter for screening mammogram for malignant neoplasm of breast: Secondary | ICD-10-CM | POA: Diagnosis not present

## 2022-08-23 ENCOUNTER — Encounter: Payer: Self-pay | Admitting: Family Medicine

## 2022-08-24 ENCOUNTER — Encounter: Payer: Self-pay | Admitting: Family Medicine

## 2022-08-24 ENCOUNTER — Ambulatory Visit (INDEPENDENT_AMBULATORY_CARE_PROVIDER_SITE_OTHER): Payer: PPO | Admitting: Family Medicine

## 2022-08-24 VITALS — BP 108/72 | HR 65 | Temp 98.4°F | Resp 18 | Ht 62.0 in | Wt 105.5 lb

## 2022-08-24 DIAGNOSIS — R0982 Postnasal drip: Secondary | ICD-10-CM

## 2022-08-24 DIAGNOSIS — B351 Tinea unguium: Secondary | ICD-10-CM

## 2022-08-24 DIAGNOSIS — L02213 Cutaneous abscess of chest wall: Secondary | ICD-10-CM | POA: Diagnosis not present

## 2022-08-24 MED ORDER — TERBINAFINE HCL 250 MG PO TABS: 250.0000 mg | ORAL_TABLET | Freq: Every day | ORAL | 0 refills | Status: DC

## 2022-08-24 NOTE — Progress Notes (Signed)
   Subjective:    Patient ID: Taylor Mclaughlin, female    DOB: 03-Apr-1945, 77 y.o.   MRN: [id number]  HPI Abscess- pt had an abscess of her chest back in January.  She reports area never completely resolved and she worries that it's worsening.    Nail fungus- both big toes.  Has been present for 'quite awhile'.  Has tried OTC products w/ minimal improvement.  Nail is thickened.  PND- ongoing issue for pt.  Previously used [employer] w/ relief.  Wondering if she should restart.   Review of Systems For ROS see HPI     Objective:   Physical Exam Vitals reviewed.  Constitutional:      General: She is not in acute distress.    Appearance: Normal appearance. She is not ill-appearing.  HENT:     Head: Normocephalic and atraumatic.     Nose: Congestion present. No rhinorrhea.  Skin:    General: Skin is warm and dry.     Findings: Lesion (firm area over lower sternum consistent w/ scarred/resolving abscess.  no fluctuance, no TTP) present.     Comments: Bilateral great toenails are thickened and discolored (yellowed)  Neurological:     General: No focal deficit present.     Mental Status: She is alert and oriented to person, place, and time.           Assessment & Plan:  Abscess of chest wall- area seems to be scarred or resolving.  No fluctuance or recurrence of infxn.  Discussed that we could do a surgical referral for definitive tx/removal or we could continue to watch.  Pt prefers to watch at this time.  Will follow.  Toenail fungus- since nails are thickened, it is unlikely that topical treatments will penetrate.  Will start oral Lamisil and have her return in 6 weeks to check LFTs.  Pt expressed understanding and is in agreement w/ plan.   Post-nasal drip- deteriorated.  Pt previously had relief w/ Flonase.  Encouraged her to restart and to add a daily antihistamine.  Pt expressed understanding and is in agreement w/ plan.

## 2022-08-24 NOTE — Patient Instructions (Signed)
Schedule a lab visit in 6 weeks to check liver functions START the Terbinafine (Lamisil) daily RESTART Flonase- 2 sprays each nostril daily ADD Claritin (Loratidine) or Zyrtec (Cetirizine) daily Monitor the area on your chest.  Right now it looks great, but if it changes, enlarges, or becomes more painful- let me know and we'll refer Call with any questions or concerns Hang in there!!!

## 2022-09-01 DIAGNOSIS — H2511 Age-related nuclear cataract, right eye: Secondary | ICD-10-CM | POA: Diagnosis not present

## 2022-09-02 DIAGNOSIS — H2512 Age-related nuclear cataract, left eye: Secondary | ICD-10-CM | POA: Diagnosis not present

## 2022-09-02 DIAGNOSIS — H2511 Age-related nuclear cataract, right eye: Secondary | ICD-10-CM | POA: Diagnosis not present

## 2022-09-08 IMAGING — CR DG KNEE 1-2V*R*
2 series · 2 of 2 positions shown · non-contrast
Comparison: X-ray right knee dated February 13, 2018

CLINICAL DATA: Bilateral knees

EXAM:
LEFT KNEE - 1-2 VIEW; RIGHT KNEE - 1-2 VIEW

[w knee ap right]
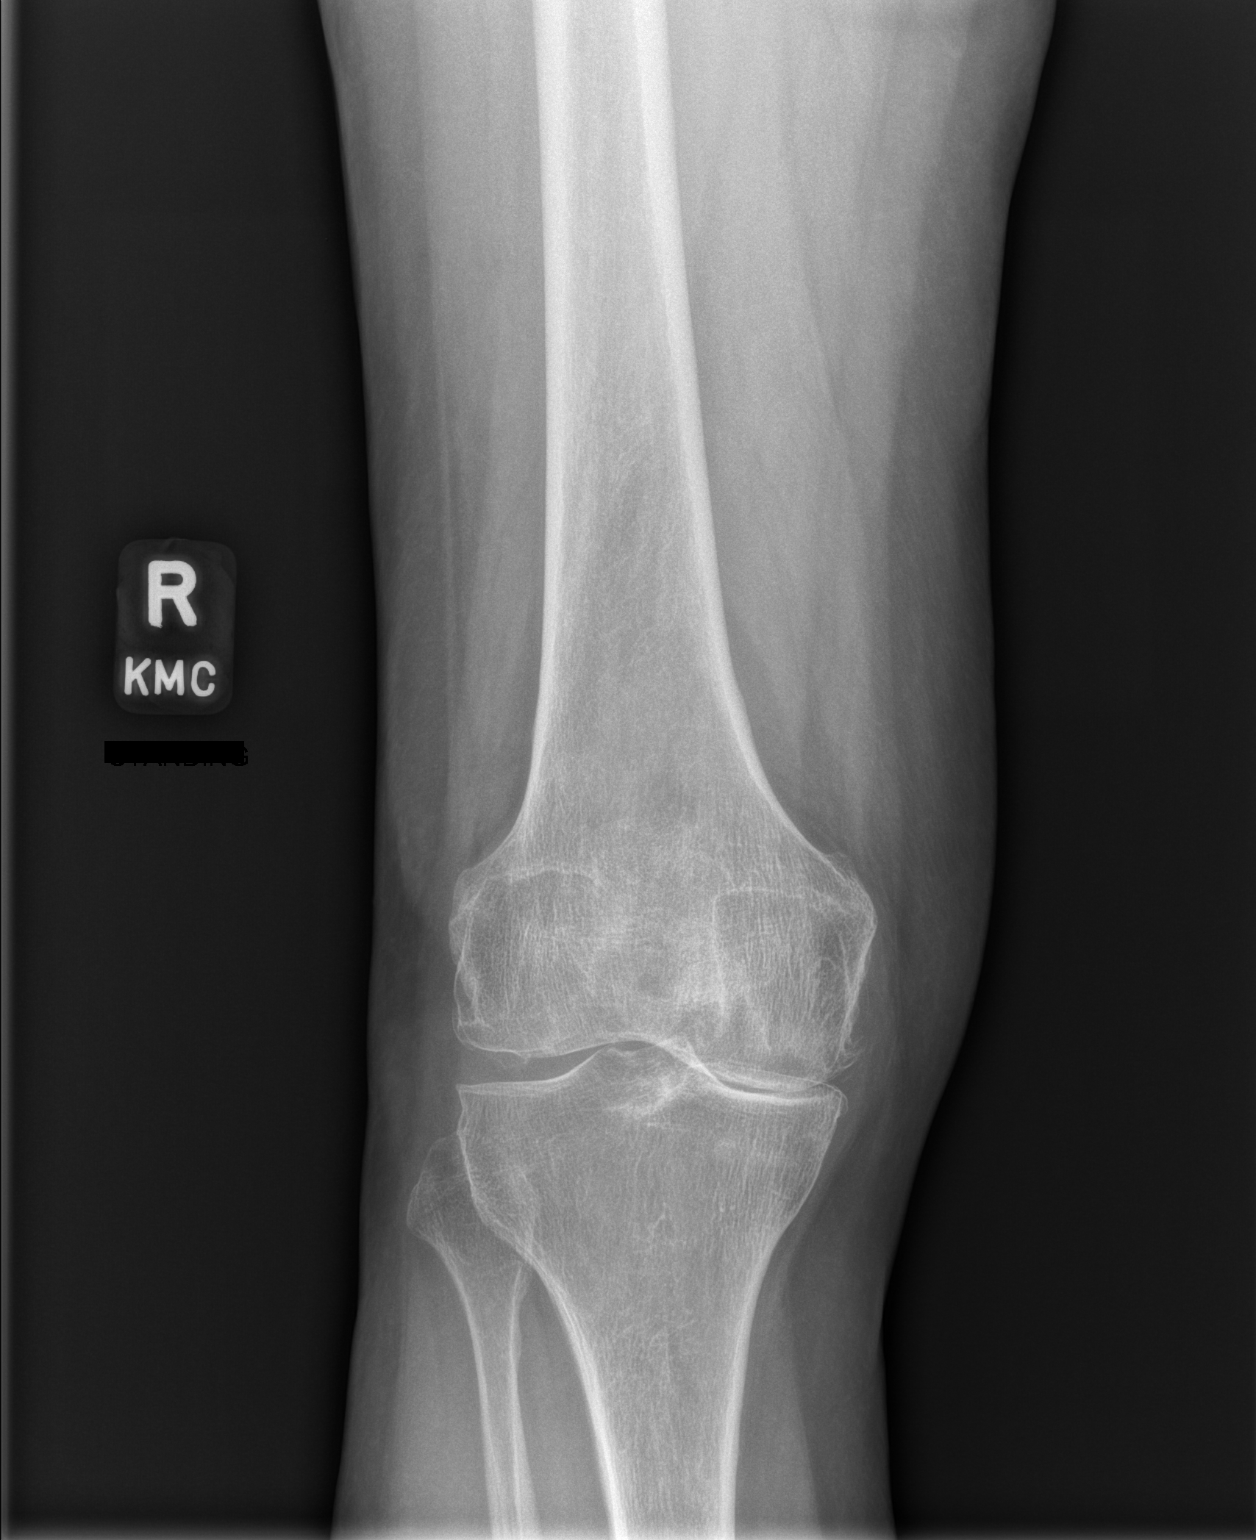

[w knee lat right]
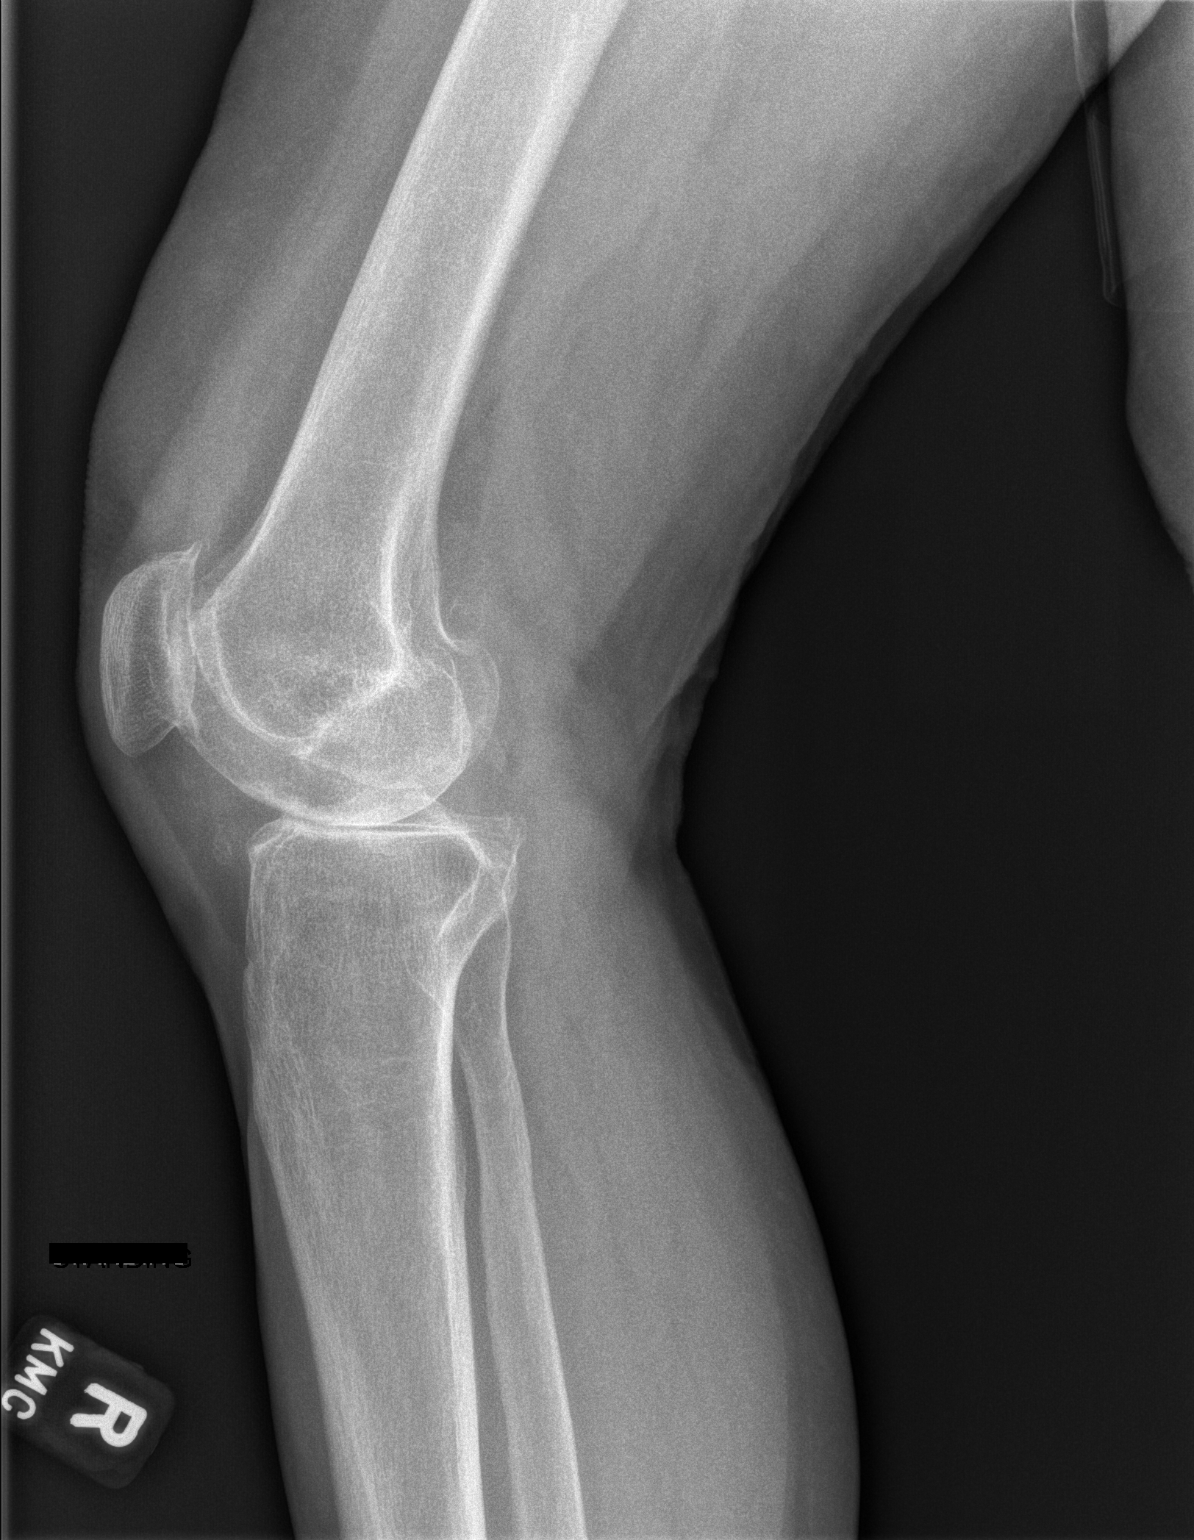

[2 of 2 positions shown; findings below may reference images not displayed]

FINDINGS: Right knee: No fracture or dislocation. Small joint effusion.
Moderate tricompartmental degenerative changes, of the medial
compartment. Mild patellofemoral compartment degenerative changes.
Findings are unchanged compared to prior exam.

Left knee: No fracture or dislocation. Small joint effusion. Mild
tricompartmental degenerative changes, most pronounced at the medial
compartment.
IMPRESSION: Right-greater-than-left degenerative changes.

Small bilateral joint effusions.

## 2022-09-08 IMAGING — CR DG KNEE 1-2V*L*
2 series · 2 of 2 positions shown · non-contrast
Comparison: X-ray right knee dated February 13, 2018

CLINICAL DATA: Bilateral knees

EXAM:
LEFT KNEE - 1-2 VIEW; RIGHT KNEE - 1-2 VIEW

[w knee lat right (1 of 2)]
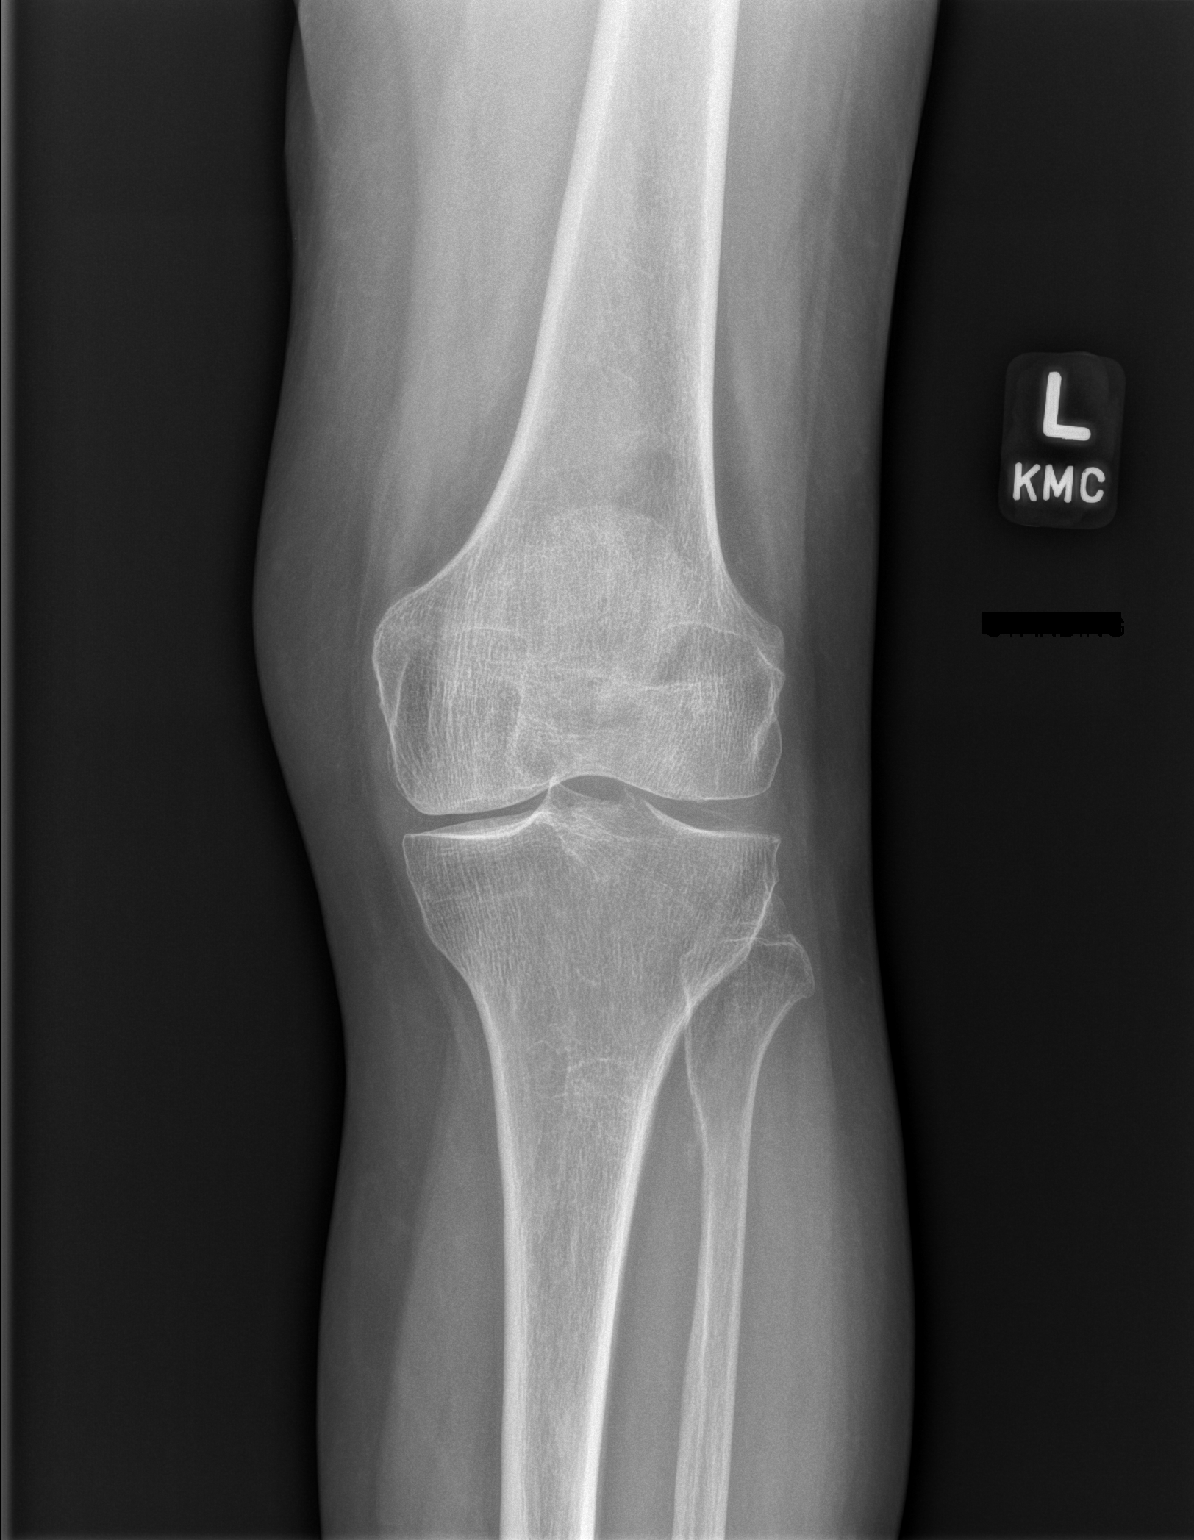

[w knee lat right (2 of 2)]
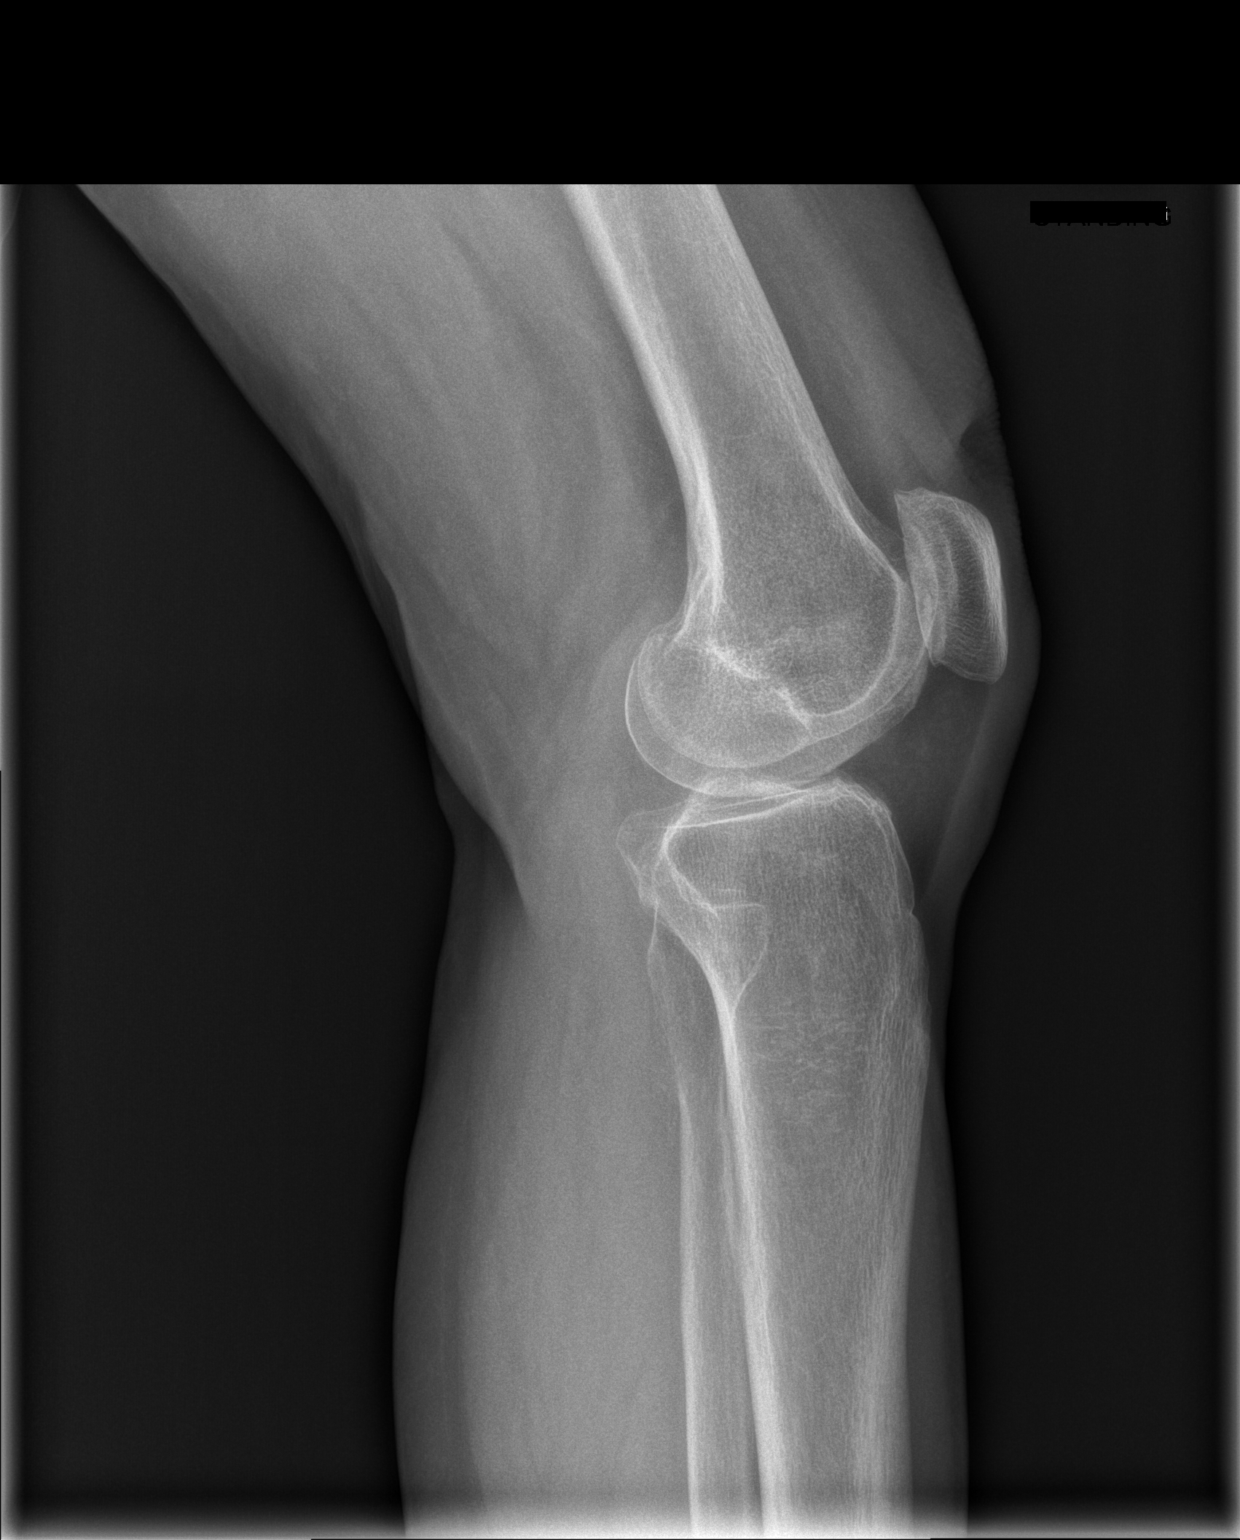

[2 of 2 positions shown; findings below may reference images not displayed]

FINDINGS: Right knee: No fracture or dislocation. Small joint effusion.
Moderate tricompartmental degenerative changes, of the medial
compartment. Mild patellofemoral compartment degenerative changes.
Findings are unchanged compared to prior exam.

Left knee: No fracture or dislocation. Small joint effusion. Mild
tricompartmental degenerative changes, most pronounced at the medial
compartment.
IMPRESSION: Right-greater-than-left degenerative changes.

Small bilateral joint effusions.

## 2022-09-15 DIAGNOSIS — H2512 Age-related nuclear cataract, left eye: Secondary | ICD-10-CM | POA: Diagnosis not present

## 2022-09-16 DIAGNOSIS — H2512 Age-related nuclear cataract, left eye: Secondary | ICD-10-CM | POA: Diagnosis not present

## 2022-09-25 IMAGING — CT CT ABD-PELV W/ CM
3 of 5 series · 17 of 46 positions shown, 19 images · IV contrast (omnipaque)
Comparison: None.

CLINICAL DATA: Acute left lower quadrant abdominal pain, nausea,
vomiting

EXAM:
CT ABDOMEN AND PELVIS WITH CONTRAST
TECHNIQUE: Multidetector CT imaging of the abdomen and pelvis was performed
using the standard protocol following bolus administration of
intravenous contrast.
CONTRAST:  80mL OMNIPAQUE IOHEXOL 300 MG/ML  SOLN

[Series 3: abdomen 5.0 · axial · 0.62mm/px · z∈[+870,+1184]mm · 12 of 75 slices shown, 14 images]
[im 6/75  soft-tissue]
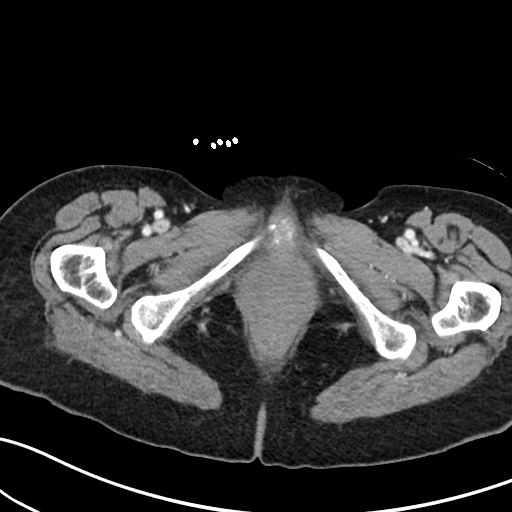
[im 6/75  bone]
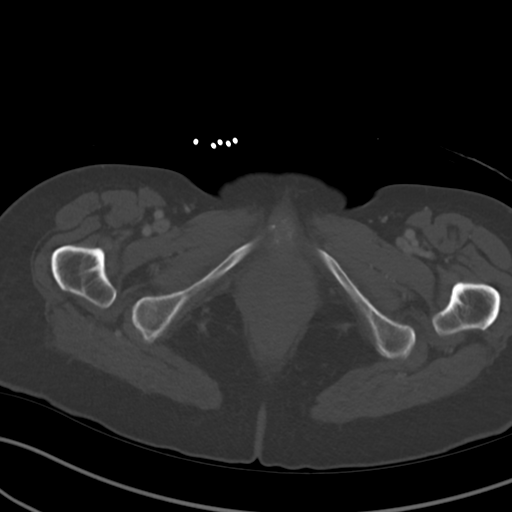
[im 12/75  soft-tissue]
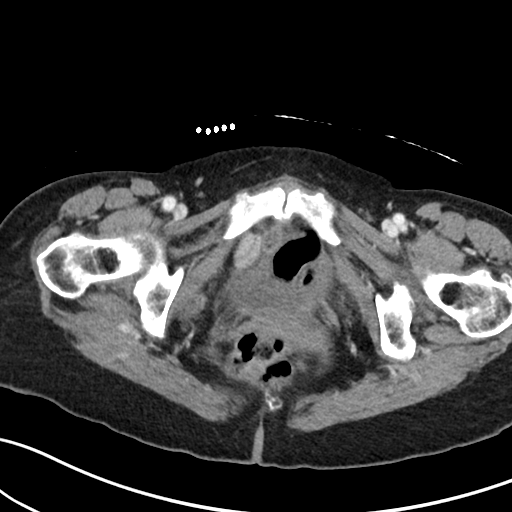
[im 18/75  soft-tissue]
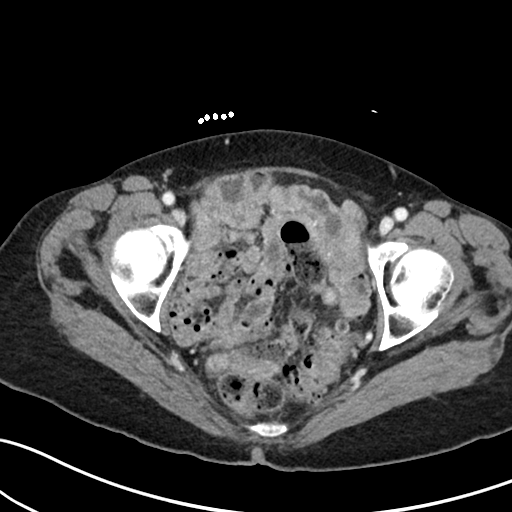
[im 23/75  soft-tissue]
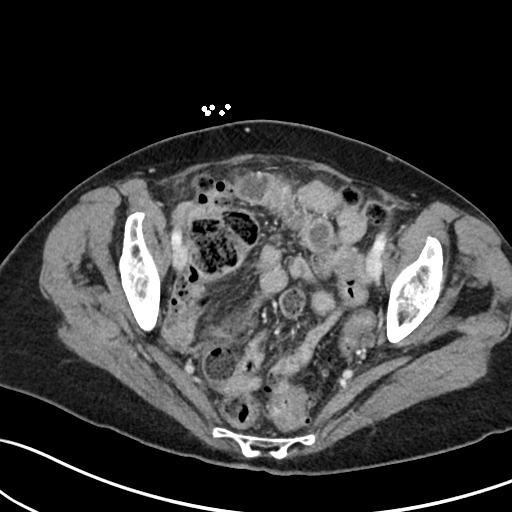
[im 29/75  soft-tissue]
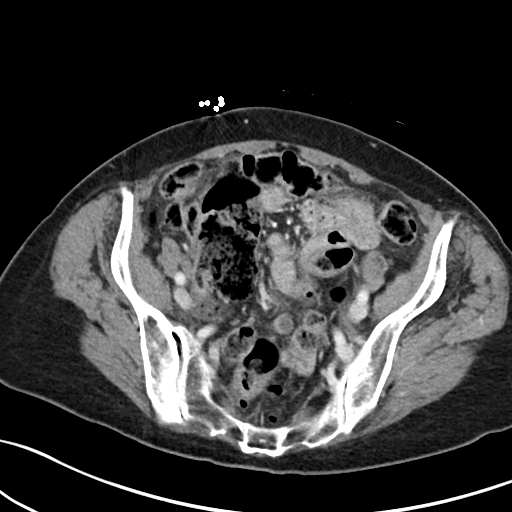
[im 35/75  soft-tissue]
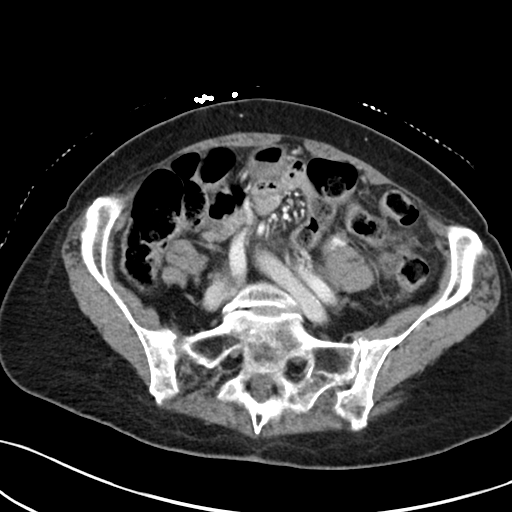
[im 40/75  soft-tissue]
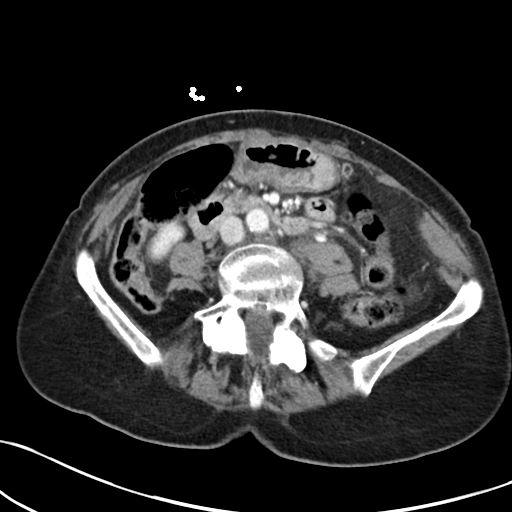
[im 46/75  soft-tissue]
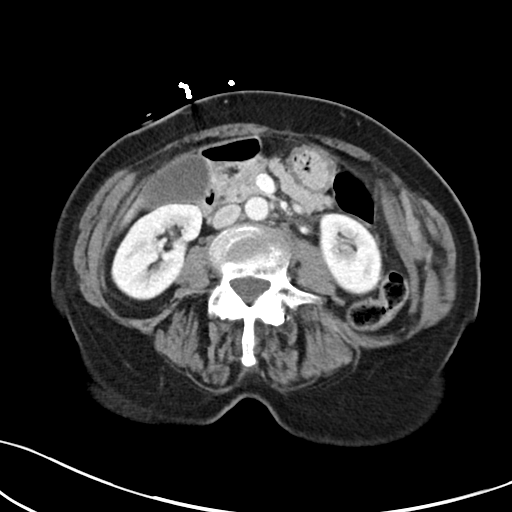
[im 52/75  soft-tissue]
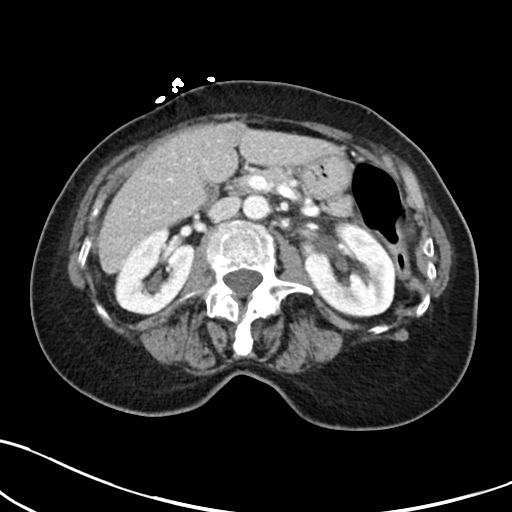
[im 52/75  bone]
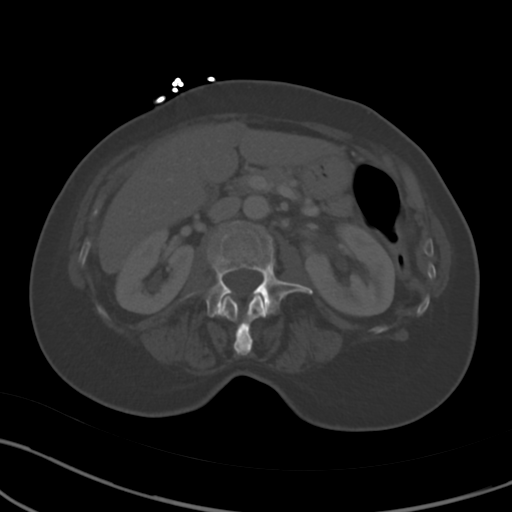
[im 57/75  soft-tissue]
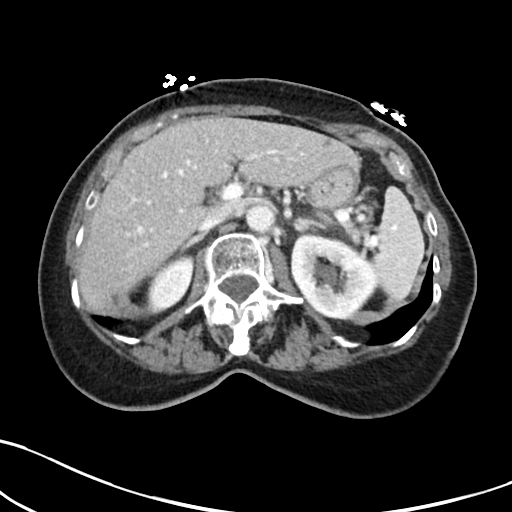
[im 63/75  soft-tissue]
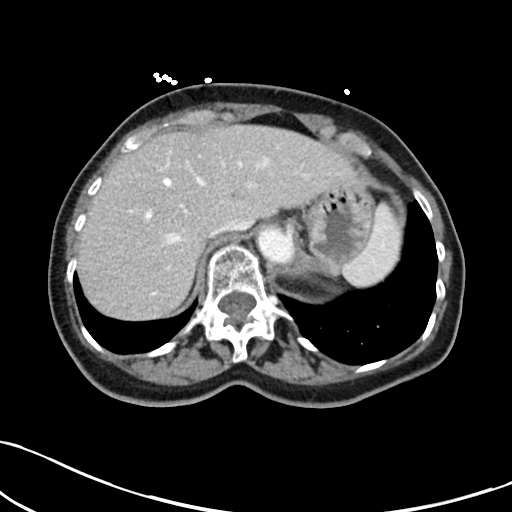
[im 69/75  soft-tissue]
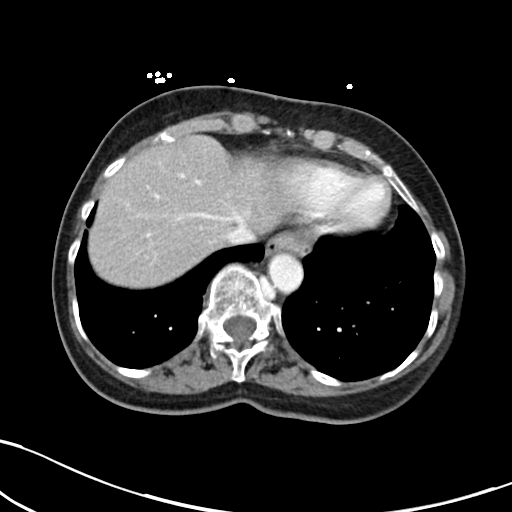

[Series 5: lung · axial · 0.62mm/px · z∈[+1104,+1114]mm · 2 of 61 slices shown]
[im 6/61  bone]
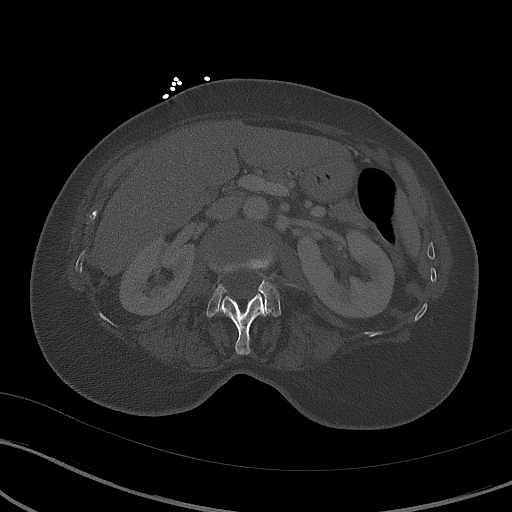
[im 11/61  bone]
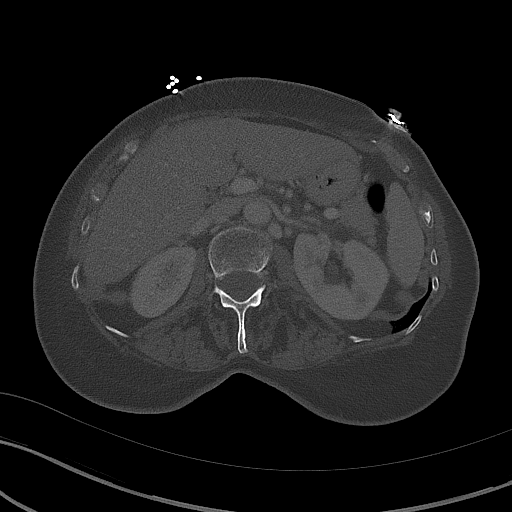

[Series 6: abdomen 3.0 mpr cor · coronal · 0.74mm/px · 3 of 82 slices shown]
[im 28/82  soft-tissue]
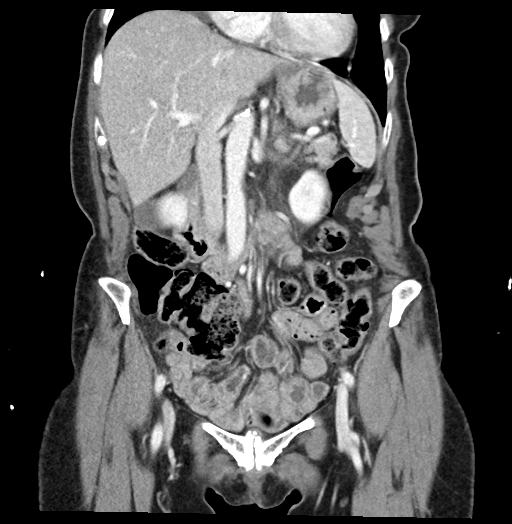
[im 37/82  soft-tissue]
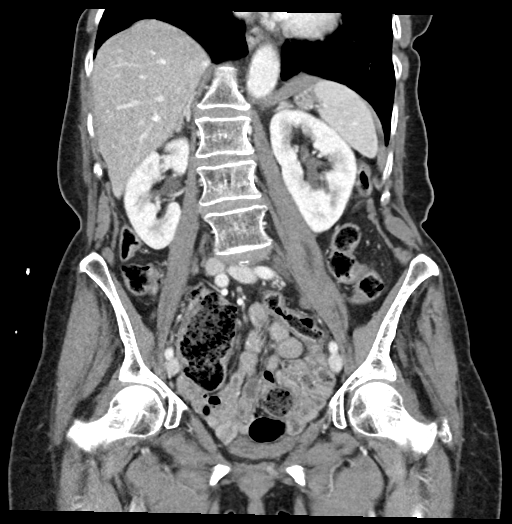
[im 46/82  soft-tissue]
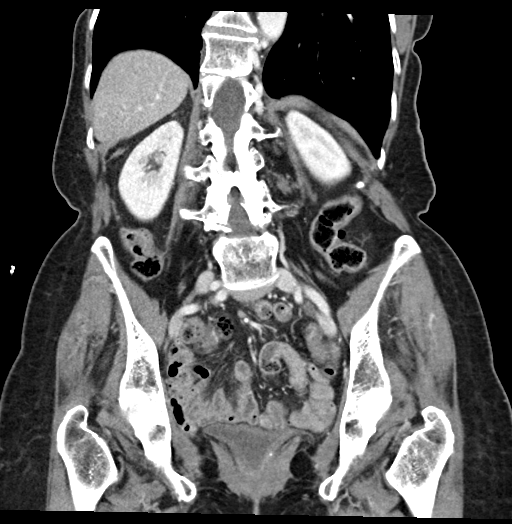

[17 of 46 positions shown; findings below may reference images not displayed]

FINDINGS: Lower chest: No acute abnormality.

Hepatobiliary: Mild hepatic steatosis. No focal liver abnormality is
seen. No gallstones, gallbladder wall thickening, or biliary
dilatation.

Pancreas: Unremarkable

Spleen: Unremarkable

Adrenals/Urinary Tract: The adrenal glands are unremarkable. The
kidneys are normal in size and position. There is mild left
hydronephrosis secondary to an obstructing 3 mm calculus at the left
ureterovesicular junction. No additional nephro or urolithiasis. No
hydronephrosis on the right. Mild left periureteric inflammatory
stranding is noted. No perinephric fluid collections. The bladder is
unremarkable.

Stomach/Bowel: Severe sigmoid diverticulosis. The stomach, small
bowel, and large bowel are otherwise unremarkable. No evidence of
obstruction or focal inflammation. Appendix normal. No free
intraperitoneal gas or fluid.

Vascular/Lymphatic: Mild atherosclerotic calcification within the
abdominal aorta. No aortic aneurysm. No pathologic adenopathy within
the abdomen and pelvis.

Reproductive: Status post hysterectomy. No adnexal masses.

Other: No abdominal wall hernia.

Musculoskeletal: No acute bone abnormality. No lytic or blastic bone
lesion.
IMPRESSION: Obstructing 3 mm calculus at the left ureterovesicular junction
resulting in mild left hydronephrosis.

Severe sigmoid diverticulosis without superimposed acute
inflammatory change.

Mild hepatic steatosis.

Aortic Atherosclerosis ([id number]).

## 2023-03-03 ENCOUNTER — Ambulatory Visit
Admission: RE | Admit: 2023-03-03 | Discharge: 2023-03-03 | Disposition: A | Discharge status: Home / Self Care | Payer: PPO | Source: Ambulatory Visit | Attending: Family Medicine | Admitting: Family Medicine

## 2023-03-03 DIAGNOSIS — E2839 Other primary ovarian failure: Secondary | ICD-10-CM | POA: Diagnosis not present

## 2023-03-03 DIAGNOSIS — M8588 Other specified disorders of bone density and structure, other site: Secondary | ICD-10-CM | POA: Diagnosis not present

## 2023-03-03 DIAGNOSIS — N958 Other specified menopausal and perimenopausal disorders: Secondary | ICD-10-CM | POA: Diagnosis not present

## 2023-03-09 ENCOUNTER — Encounter: Payer: Self-pay | Admitting: Family Medicine

## 2023-03-09 ENCOUNTER — Telehealth: Payer: Self-pay

## 2023-03-09 NOTE — Telephone Encounter (Signed)
-----   Message from Neena Rhymes sent at 03/09/2023  7:43 AM EST ----- Your bone density test shows that you have osteoporosis of your spine and L hip.  Based on this, it is recommended that we start treatment.  If you are willing, I would start Fosamax 70mg  weekly (#12, 3 refills) in addition to daily calcium and Vit D.

## 2023-03-10 NOTE — Telephone Encounter (Signed)
 Patient has questions about her bone density scan and the recommended medication.

## 2023-03-21 NOTE — Telephone Encounter (Signed)
 Please call patient to schedule appointment. Patient availability is 3/18-3/21 except for 3/20 in the am she is not available.

## 2023-03-22 ENCOUNTER — Encounter: Payer: Self-pay | Admitting: Family Medicine

## 2023-03-22 ENCOUNTER — Ambulatory Visit (INDEPENDENT_AMBULATORY_CARE_PROVIDER_SITE_OTHER): Admitting: Family Medicine

## 2023-03-22 VITALS — BP 110/62 | HR 68 | Temp 98.3°F | Ht 60.0 in | Wt 107.1 lb

## 2023-03-22 DIAGNOSIS — M810 Age-related osteoporosis without current pathological fracture: Secondary | ICD-10-CM | POA: Diagnosis not present

## 2023-03-22 DIAGNOSIS — N6341 Unspecified lump in right breast, subareolar: Secondary | ICD-10-CM | POA: Diagnosis not present

## 2023-03-22 DIAGNOSIS — H6123 Impacted cerumen, bilateral: Secondary | ICD-10-CM | POA: Diagnosis not present

## 2023-03-22 MED ORDER — ALENDRONATE SODIUM 70 MG PO TABS: 70.0000 mg | ORAL_TABLET | ORAL | 3 refills | Status: AC

## 2023-03-22 NOTE — Telephone Encounter (Signed)
 Pt is scheduled for 3/19 per [facility]

## 2023-03-22 NOTE — Progress Notes (Signed)
   Subjective:    Patient ID: Taylor Mclaughlin, female    DOB: 1945/05/04, 78 y.o.   MRN: [id number]  HPI Osteoporosis- most recent DEXA revealed worsening bone density/osteoporosis.  I had recommended starting Fosamax but pt is anxious about this.    Subareolar mass- R sided, freely mobile, oblong shape.  Normal mammo in July.  Excessive ear wax- pt reports copious wax when she removes her hearing aids.   Review of Systems For ROS see HPI     Objective:   Physical Exam Vitals reviewed.  Constitutional:      General: She is not in acute distress.    Appearance: Normal appearance. She is not ill-appearing.  HENT:     Head: Normocephalic and atraumatic.     Right Ear: Tympanic membrane and ear canal normal. There is no impacted cerumen.     Left Ear: Tympanic membrane and ear canal normal. There is no impacted cerumen.  Cardiovascular:     Rate and Rhythm: Normal rate.  Pulmonary:     Effort: Pulmonary effort is normal. No respiratory distress.     Comments: Breast exam deferred Musculoskeletal:     Cervical back: Neck supple.  Lymphadenopathy:     Cervical: No cervical adenopathy.  Skin:    General: Skin is warm and dry.  Neurological:     General: No focal deficit present.     Mental Status: She is alert and oriented to person, place, and time.  Psychiatric:        Mood and Affect: Mood normal.        Behavior: Behavior normal.        Thought Content: Thought content normal.           Assessment & Plan:  Subareolar mass- ongoing.  R sided.  She had normal mammogram in July.  No pain, no skin changes, no drainage but present for awhile.  Will get diagnostic mammo to better assess.  Pt expressed understanding and is in agreement w/ plan.   Excessive wax- pt's ears today are mostly clean w/ the exception of a thin layer of wax lining ear canals inferiorly.  Discussed using peroxide to keep ears lclean.  Pt expressed understanding and is in agreement w/ plan.

## 2023-03-22 NOTE — Assessment & Plan Note (Signed)
 Deteriorated.  Pt's recent DEXA scan showed statistically significant worsening of her bone mass.  Based on this, I recommended starting Fosamax.  She wanted to have a discuss about this today- how to take it, what are the possible side effects, a plan for going forward.  We talked about GI side effects being the biggest issue for most people.  If for whatever reason she is not able to tolerate the medication, we can try and get Prolia approved due to bisphosphonate intolerance.  We discussed that if DEXA wasn't stable or better in 2 yrs, that would also be a reason to switch.  Pt expressed understanding and is in agreement w/ plan.

## 2023-03-22 NOTE — Patient Instructions (Signed)
 Follow up as needed or as scheduled START the Fosamax once weekly- it does not matter which day Continue your daily Calcium + Vit D (2 tabs) and a multivitamin daily RESTART your magnesium We'll call you to schedule the diagnostic mammogram of your R breast To help lessen ear wax, saturate a cotton ball w/ peroxide and drip into your ears.  The bubbling action of the peroxide will dissolve the wax Call with any questions or concerns Happy Spring!!

## 2023-03-22 NOTE — Telephone Encounter (Signed)
 Pt was seen in office today 03/22/2023 by Dr.Tabori

## 2023-03-29 ENCOUNTER — Other Ambulatory Visit: Payer: Self-pay | Admitting: Family Medicine

## 2023-03-29 DIAGNOSIS — N6341 Unspecified lump in right breast, subareolar: Secondary | ICD-10-CM

## 2023-04-24 ENCOUNTER — Ambulatory Visit
Admission: RE | Admit: 2023-04-24 | Discharge: 2023-04-24 | Disposition: A | Discharge status: Home / Self Care | Source: Ambulatory Visit | Attending: Family Medicine | Admitting: Family Medicine

## 2023-04-24 DIAGNOSIS — N6341 Unspecified lump in right breast, subareolar: Secondary | ICD-10-CM

## 2023-04-24 DIAGNOSIS — N6315 Unspecified lump in the right breast, overlapping quadrants: Secondary | ICD-10-CM | POA: Diagnosis not present

## 2023-04-26 ENCOUNTER — Encounter: Payer: Self-pay | Admitting: Family Medicine

## 2023-04-26 DIAGNOSIS — B351 Tinea unguium: Secondary | ICD-10-CM

## 2023-04-26 NOTE — Telephone Encounter (Signed)
 Future lab was placed by Dr.Tabori in August of 2024 and patient has until August of 2025 to complete lab.

## 2023-05-03 ENCOUNTER — Other Ambulatory Visit (INDEPENDENT_AMBULATORY_CARE_PROVIDER_SITE_OTHER)

## 2023-05-03 ENCOUNTER — Encounter: Payer: Self-pay | Admitting: Family Medicine

## 2023-05-03 DIAGNOSIS — B351 Tinea unguium: Secondary | ICD-10-CM | POA: Diagnosis not present

## 2023-05-03 LAB — HEPATIC FUNCTION PANEL
ALT: 14 U/L (ref 0–35)
AST: 22 U/L (ref 0–37)
Albumin: 4.2 g/dL (ref 3.5–5.2)
Alkaline Phosphatase: 79 U/L (ref 39–117)
Bilirubin, Direct: 0.1 mg/dL (ref 0.0–0.3)
Total Bilirubin: 0.5 mg/dL (ref 0.2–1.2)
Total Protein: 7.3 g/dL (ref 6.0–8.3)

## 2023-05-03 NOTE — Telephone Encounter (Signed)
 Patient is asking if theres a time limit she should puton her

## 2023-06-06 ENCOUNTER — Ambulatory Visit (INDEPENDENT_AMBULATORY_CARE_PROVIDER_SITE_OTHER): Admitting: *Deleted

## 2023-06-06 DIAGNOSIS — Z0000 Encounter for general adult medical examination without abnormal findings: Secondary | ICD-10-CM | POA: Diagnosis not present

## 2023-06-06 NOTE — Progress Notes (Signed)
 Subjective:   Taylor Mclaughlin is a 78 y.o. female who presents for Medicare Annual (Subsequent) preventive examination.  Visit Complete: Virtual I connected with  Taylor Mclaughlin on 06/06/23 by a audio enabled telemedicine application and verified that I am speaking with the correct person using two identifiers.  Patient Location: Home  Provider Location: Home Office  I discussed the limitations of evaluation and management by telemedicine. The patient expressed understanding and agreed to proceed.  Vital Signs: Because this visit was a virtual/telehealth visit, some criteria may be missing or patient reported. Any vitals not documented were not able to be obtained and vitals that have been documented are patient reported.  Patient Medicare AWV questionnaire was completed by the patient on 05-31-2023; I have confirmed that all information answered by patient is correct and no changes since this date.  Cardiac Risk Factors include: advanced age (>78men, >33 women)     Objective:     There were no vitals filed for this visit. There is no height or weight on file to calculate BMI.     06/06/2023    1:15 PM 06/16/2022    1:10 PM 06/10/2021    2:08 PM 09/27/2020    7:12 PM 09/27/2020    7:05 PM 02/07/2018    8:34 AM 02/01/2017    2:33 PM  Advanced Directives  Does Patient Have a Medical Advance Directive? Yes Yes Yes No No Yes Yes  Type of Sales promotion account executive of [employer] Power of [place];Living will   Healthcare Power of [place];Living will Living will;Healthcare Power of Attorney  Copy of Healthcare Power of Attorney in Chart? No - copy requested No - copy requested No - copy requested   No - copy requested No - copy requested    Current Medications (verified) Outpatient Encounter Medications as of 06/06/2023  Medication Sig   alendronate  (FOSAMAX ) 70 MG tablet Take 1 tablet (70 mg total) by mouth every 7 (seven) days. Take with a full  glass of water  on an empty stomach.   Calcium Carbonate-Vitamin D  500-125 MG-UNIT TABS Take by mouth.   COMIRNATY syringe    Ferrous Sulfate (IRON) 325 (65 Fe) MG TABS    Multiple Vitamins-Minerals (MULTIVITAMIN ADULT PO) Take 500 mg by mouth daily.   terbinafine  (LAMISIL ) 250 MG tablet Take 1 tablet (250 mg total) by mouth daily.   Ferrous Sulfate 50 MG TBCR    FLUZONE HIGH-DOSE 0.5 ML injection  (Patient not taking: Reported on 06/06/2023)   Turmeric, Curcuma [place], POWD    No facility-administered encounter medications on file as of 06/06/2023.    Allergies (verified) Patient has no known allergies.   History: Past Medical History:  Diagnosis Date   Arthritis    Prolapse of female pelvic organs    Urgency of urination    Past Surgical History:  Procedure Laterality Date   ABDOMINAL HYSTERECTOMY     CYSTO N/A 12/30/2013   Procedure: CYSTO;  Surgeon: Andrez Banker, MD;  Location: [facility];  Service: Urology;  Laterality: N/A;   HAND SURGERY  2000   L THUMB   REPLACEMENT TOTAL KNEE Right 06/21/2021   ROBOTIC ASSISTED LAPAROSCOPIC HYSTERECTOMY AND SALPINGECTOMY N/A 12/30/2013   Procedure: ROBOTIC ASSISTED LAPAROSCOPIC HYSTERECTOMY AND SACROCOLPOPEXY;  Surgeon: Andrez Banker, MD;  Location: [facility];  Service: Urology;  Laterality: N/A;   TONSILLECTOMY  1952   TUBAL LIGATION  1975   WISDOM TOOTH EXTRACTION  1993  Family History  Problem Relation Age of Onset   Hyperlipidemia Mother    Parkinson's disease Father    Depression Daughter    Macular degeneration Brother    Depression Son    Breast cancer Neg Hx    Social History   Socioeconomic History   Marital status: Married    Spouse name: Not on file   Number of children: Not on file   Years of education: Not on file   Highest education level: Bachelor's degree (e.g., BA, AB, BS)  Occupational History   Not on file  Tobacco Use   Smoking status: Never   Smokeless tobacco: Never  Substance and Sexual  Activity   Alcohol use: Yes    Comment: OCCASIONAL   Drug use: No   Sexual activity: Not Currently  Other Topics Concern   Not on file  Social History Narrative   Not on file   Social Drivers of Health   Financial Resource Strain: Low Risk  (06/06/2023)   Overall Financial Resource Strain (CARDIA)    Difficulty of Paying Living Expenses: Not hard at all  Food Insecurity: No Food Insecurity (06/06/2023)   Hunger Vital Sign    Worried About Running Out of Food in the Last Year: Never true    Ran Out of Food in the Last Year: Never true  Transportation Needs: No Transportation Needs (06/06/2023)   PRAPARE - Administrator, Civil Service (Medical): No    Lack of Transportation (Non-Medical): No  Physical Activity: Sufficiently Active (06/06/2023)   Exercise Vital Sign    Days of Exercise per Week: 6 days    Minutes of Exercise per Session: 30 min  Stress: Stress Concern Present (06/06/2023)   [employer] of Occupational Health - Occupational Stress Questionnaire    Feeling of Stress : To some extent  Social Connections: Moderately Integrated (06/06/2023)   Social Connection and Isolation Panel [NHANES]    Frequency of Communication with Friends and Family: Twice a week    Frequency of Social Gatherings with Friends and Family: Once a week    Attends Religious Services: Never    Database administrator or Organizations: Yes    Attends Engineer, structural: More than 4 times per year    Marital Status: Married    Tobacco Counseling Counseling given: Not Answered   Clinical Intake:  Pre-visit preparation completed: Yes  Pain : No/denies pain     Diabetes: No  How often do you need to have someone help you when you read instructions, pamphlets, or other written materials from your doctor or pharmacy?: 1 - Never  Interpreter Needed?: No  Information entered by :: Kieth Pelt LPN   Activities of Daily Living    06/06/2023    1:18 PM 05/31/2023    3:53  PM  In your present state of health, do you have any difficulty performing the following activities:  Hearing? 1 1  Vision? 0 0  Difficulty concentrating or making decisions? 0 0  Walking or climbing stairs? 0 0  Dressing or bathing? 0 0  Doing errands, shopping? 0 0  Preparing Food and eating ? N N  Using the Toilet? N N  In the past six months, have you accidently leaked urine? Y Y  Do you have problems with loss of bowel control? N N  Managing your Medications? N N  Managing your Finances? N N  Housekeeping or managing your Housekeeping? N N    Patient Care  Team: Tabori, Katherine E, MD as PCP - General (Family Medicine)  Indicate any recent Medical Services you may have received from other than [facility] providers in the past year (date may be approximate).     Assessment:    This is a routine wellness examination for Rianna.  Hearing/Vision screen Hearing Screening - Comments:: Hearing aid No trouble hearing with aid Vision Screening - Comments:: Up to date Cataract surgery [facility]   Goals Addressed             This Visit's Progress    Increase physical activity   On track    Increase activity.      Patient Stated   On track    Tighten core by increasing activities.      Patient Stated   On track    Would like to finish of projects     Patient Stated       Stay healthy        Depression Screen    06/06/2023    1:19 PM 03/22/2023    9:15 AM 08/24/2022    9:07 AM 06/16/2022    1:14 PM 01/26/2022   10:30 AM 01/10/2022    9:42 AM 06/10/2021    2:08 PM  PHQ 2/9 Scores  PHQ - 2 Score 2 1 1  0 0 0 0  PHQ- 9 Score 4 3  2 1 1      Fall Risk    06/06/2023    1:17 PM 05/31/2023    3:53 PM 03/22/2023    9:14 AM 08/24/2022    9:07 AM 06/16/2022    1:10 PM  Fall Risk   Falls in the past year? 0 0 0 0 0  Number falls in past yr: 0  0 0 0  Injury with Fall? 0  0 0 0  Risk for fall due to :    No Fall Risks   Follow up Falls evaluation completed;Education  provided;Falls prevention discussed   Falls evaluation completed Falls evaluation completed;Education provided;Falls prevention discussed    MEDICARE RISK AT HOME: Medicare Risk at Home Any stairs in or around the home?: Yes If so, are there any without handrails?: No Home free of loose throw rugs in walkways, pet beds, electrical cords, etc?: No Adequate lighting in your home to reduce risk of falls?: Yes Life alert?: No Use of a cane, walker or w/c?: No Grab bars in the bathroom?: Yes Shower chair or bench in shower?: No Elevated toilet seat or a handicapped toilet?: No  TIMED UP AND GO:  Was the test performed?  No    Cognitive Function:    02/07/2018    8:38 AM 02/01/2017    2:37 PM  MMSE - Mini Mental State Exam  Orientation to time 5 5  Orientation to Place 5 5  Registration 3 3  Attention/ Calculation 5 5  Recall 3 3  Language- name 2 objects 2 2  Language- repeat 1 1  Language- follow 3 step command 3 3  Language- read & follow direction 1 1  Write a sentence 1 1  Copy design 1 1  Total score 30 30        06/06/2023    1:18 PM 06/16/2022    1:11 PM  6CIT Screen  What Year? 0 points 0 points  What month? 0 points 0 points  What time? 0 points 0 points  Count back from 20 0 points 0 points  Months in reverse 0  points 0 points  Repeat phrase 0 points 0 points  Total Score 0 points 0 points    Immunizations Immunization History  Administered Date(s) Administered   Fluad Quad(high Dose 65+) 10/10/2018, 10/09/2019   Fluzone Influenza virus vaccine,trivalent (IIV3), split virus 10/22/2012   Influenza, High Dose Seasonal PF 10/09/2015, 10/18/2017   Influenza,inj,Quad PF,6+ Mos 10/04/2016   Influenza-Unspecified 09/01/2021, 11/07/2022   PFIZER(Purple Top)SARS-COV-2 Vaccination 02/09/2019, 03/06/2019, 10/18/2019, 08/03/2020   Pneumococcal Conjugate-13 08/20/2013   Pneumococcal Polysaccharide-23 04/08/2009, 02/26/2016   Tdap 07/04/2006, 02/07/2018   Zoster  Recombinant(Shingrix) 05/16/2017   Zoster, Live 04/20/2009    TDAP status: Up to date  Flu Vaccine status: Up to date  Pneumococcal vaccine status: Up to date  Covid-19 vaccine status: Information provided on how to obtain vaccines.   Qualifies for Shingles Vaccine? Yes   Zostavax completed No   Shingrix Completed?: No.    Education has been provided regarding the importance of this vaccine. Patient has been advised to call insurance company to determine out of pocket expense if they have not yet received this vaccine. Advised may also receive vaccine at local pharmacy or Health Dept. Verbalized acceptance and understanding.  Screening Tests Health Maintenance  Topic Date Due   Zoster Vaccines- Shingrix (2 of 2) 07/11/2017   INFLUENZA VACCINE  08/04/2023   Medicare Annual Wellness (AWV)  06/05/2024   DTaP/Tdap/Td (3 - Td or Tdap) 02/08/2028   Pneumonia Vaccine 46+ Years old  Completed   DEXA SCAN  Completed   Hepatitis C Screening  Completed   HPV VACCINES  Aged Out   Meningococcal B Vaccine  Aged Out   Colonoscopy  Discontinued   COVID-19 Vaccine  Discontinued    Health Maintenance  Health Maintenance Due  Topic Date Due   Zoster Vaccines- Shingrix (2 of 2) 07/11/2017    Colorectal cancer screening: No longer required.   Mammogram status: Completed  . Repeat every year  Bone Density status: Completed 2025. Results reflect: Bone density results: OSTEOPOROSIS. Repeat every 2 years.  Lung Cancer Screening: (Low Dose CT Chest recommended if Age 39-80 years, 20 pack-year currently smoking OR have quit w/in 15years.) does not qualify.   Lung Cancer Screening Referral:   Additional Screening:  Hepatitis C Screening: does not qualify; Completed 2019  Vision Screening: Recommended annual ophthalmology exams for early detection of glaucoma and other disorders of the eye. Is the patient up to date with their annual eye exam?  Yes  Who is the provider or what is the name  of the office in which the patient attends annual eye exams? [facility] If pt is not established with a provider, would they like to be referred to a provider to establish care? No .   Dental Screening: Recommended annual dental exams for proper oral hygiene   Community Resource Referral / Chronic Care Management: CRR required this visit?  No   CCM required this visit?  No     Plan:     I have personally reviewed and noted the following in the patient's chart:   Medical and social history Use of alcohol, tobacco or illicit drugs  Current medications and supplements including opioid prescriptions. Patient is not currently taking opioid prescriptions. Functional ability and status Nutritional status Physical activity Advanced directives List of other physicians Hospitalizations, surgeries, and ER visits in previous 12 months Vitals Screenings to include cognitive, depression, and falls Referrals and appointments  In addition, I have reviewed and discussed with patient certain preventive protocols, quality metrics,  and best practice recommendations. A written personalized care plan for preventive services as well as general preventive health recommendations were provided to patient.     Kieth Pelt, LPN   4/0/9811   After Visit Summary: (MyChart) Due to this being a telephonic visit, the after visit summary with patients personalized plan was offered to patient via MyChart   Nurse Notes:

## 2023-06-06 NOTE — Patient Instructions (Signed)
 Taylor Mclaughlin , Thank you for taking time to come for your Medicare Wellness Visit. I appreciate your ongoing commitment to your health goals. Please review the following plan we discussed and let me know if I can assist you in the future.  Screening recommendations/referrals: Colonoscopy: no longer required Mammogram: up to date Bone Density: up to date Recommended yearly ophthalmology/optometry visit for glaucoma screening and checkup Recommended yearly dental visit for hygiene and checkup  Vaccinations: Influenza vaccine: up to date Pneumococcal vaccine: up to date Tdap vaccine: up to date Shingles vaccine: up to date      Preventive Care 78 Years and Older, Female Preventive care refers to lifestyle choices and visits with your health care provider that can promote health and wellness. What does preventive care include? A yearly physical exam. This is also called an annual well check. Dental exams once or twice a year. Routine eye exams. Ask your health care provider how often you should have your eyes checked. Personal lifestyle choices, including: Daily care of your teeth and gums. Regular physical activity. Eating a healthy diet. Avoiding tobacco and drug use. Limiting alcohol use. Practicing safe sex. Taking low-dose aspirin every day. Taking vitamin and mineral supplements as recommended by your health care provider. What happens during an annual well check? The services and screenings done by your health care provider during your annual well check will depend on your age, overall health, lifestyle risk factors, and family history of disease. Counseling  Your health care provider may ask you questions about your: Alcohol use. Tobacco use. Drug use. Emotional well-being. Home and relationship well-being. Sexual activity. Eating habits. History of falls. Memory and ability to understand (cognition). Work and work Astronomer. Reproductive health. Screening  You may  have the following tests or measurements: Height, weight, and BMI. Blood pressure. Lipid and cholesterol levels. These may be checked every 5 years, or more frequently if you are over 64 years old. Skin check. Lung cancer screening. You may have this screening every year starting at age 65 if you have a 30-pack-year history of smoking and currently smoke or have quit within the past 15 years. Fecal occult blood test (FOBT) of the stool. You may have this test every year starting at age 81. Flexible sigmoidoscopy or colonoscopy. You may have a sigmoidoscopy every 5 years or a colonoscopy every 10 years starting at age 67. Hepatitis C blood test. Hepatitis B blood test. Sexually transmitted disease (STD) testing. Diabetes screening. This is done by checking your blood sugar (glucose) after you have not eaten for a while (fasting). You may have this done every 1-3 years. Bone density scan. This is done to screen for osteoporosis. You may have this done starting at age 65. Mammogram. This may be done every 1-2 years. Talk to your health care provider about how often you should have regular mammograms. Talk with your health care provider about your test results, treatment options, and if necessary, the need for more tests. Vaccines  Your health care provider may recommend certain vaccines, such as: Influenza vaccine. This is recommended every year. Tetanus, diphtheria, and acellular pertussis (Tdap, Td) vaccine. You may need a Td booster every 10 years. Zoster vaccine. You may need this after age 51. Pneumococcal 13-valent conjugate (PCV13) vaccine. One dose is recommended after age 33. Pneumococcal polysaccharide (PPSV23) vaccine. One dose is recommended after age 40. Talk to your health care provider about which screenings and vaccines you need and how often you need them. This information  is not intended to replace advice given to you by your health care provider. Make sure you discuss any  questions you have with your health care provider. Document Released: 01/16/2015 Document Revised: 09/09/2015 Document Reviewed: 10/21/2014 Elsevier Interactive Patient Education  2017 [employer].  Fall Prevention in the Home Falls can cause injuries. They can happen to people of all ages. There are many things you can do to make your home safe and to help prevent falls. What can I do on the outside of my home? Regularly fix the edges of walkways and driveways and fix any cracks. Remove anything that might make you trip as you walk through a door, such as a raised step or threshold. Trim any bushes or trees on the path to your home. Use bright outdoor lighting. Clear any walking paths of anything that might make someone trip, such as rocks or tools. Regularly check to see if handrails are loose or broken. Make sure that both sides of any steps have handrails. Any raised decks and porches should have guardrails on the edges. Have any leaves, snow, or ice cleared regularly. Use sand or salt on walking paths during winter. Clean up any spills in your garage right away. This includes oil or grease spills. What can I do in the bathroom? Use night lights. Install grab bars by the toilet and in the tub and shower. Do not use towel bars as grab bars. Use non-skid mats or decals in the tub or shower. If you need to sit down in the shower, use a plastic, non-slip stool. Keep the floor dry. Clean up any water  that spills on the floor as soon as it happens. Remove soap buildup in the tub or shower regularly. Attach bath mats securely with double-sided non-slip rug tape. Do not have throw rugs and other things on the floor that can make you trip. What can I do in the bedroom? Use night lights. Make sure that you have a light by your bed that is easy to reach. Do not use any sheets or blankets that are too big for your bed. They should not hang down onto the floor. Have a firm chair that has side  arms. You can use this for support while you get dressed. Do not have throw rugs and other things on the floor that can make you trip. What can I do in the kitchen? Clean up any spills right away. Avoid walking on wet floors. Keep items that you use a lot in easy-to-reach places. If you need to reach something above you, use a strong step stool that has a grab bar. Keep electrical cords out of the way. Do not use floor polish or wax that makes floors slippery. If you must use wax, use non-skid floor wax. Do not have throw rugs and other things on the floor that can make you trip. What can I do with my stairs? Do not leave any items on the stairs. Make sure that there are handrails on both sides of the stairs and use them. Fix handrails that are broken or loose. Make sure that handrails are as long as the stairways. Check any carpeting to make sure that it is firmly attached to the stairs. Fix any carpet that is loose or worn. Avoid having throw rugs at the top or bottom of the stairs. If you do have throw rugs, attach them to the floor with carpet tape. Make sure that you have a light switch at the top of  the stairs and the bottom of the stairs. If you do not have them, ask someone to add them for you. What else can I do to help prevent falls? Wear shoes that: Do not have high heels. Have rubber bottoms. Are comfortable and fit you well. Are closed at the toe. Do not wear sandals. If you use a stepladder: Make sure that it is fully opened. Do not climb a closed stepladder. Make sure that both sides of the stepladder are locked into place. Ask someone to hold it for you, if possible. Clearly mark and make sure that you can see: Any grab bars or handrails. First and last steps. Where the edge of each step is. Use tools that help you move around (mobility aids) if they are needed. These include: Canes. Walkers. Scooters. Crutches. Turn on the lights when you go into a dark area.  Replace any light bulbs as soon as they burn out. Set up your furniture so you have a clear path. Avoid moving your furniture around. If any of your floors are uneven, fix them. If there are any pets around you, be aware of where they are. Review your medicines with your doctor. Some medicines can make you feel dizzy. This can increase your chance of falling. Ask your doctor what other things that you can do to help prevent falls. This information is not intended to replace advice given to you by your health care provider. Make sure you discuss any questions you have with your health care provider. Document Released: 10/16/2008 Document Revised: 05/28/2015 Document Reviewed: 01/24/2014 Elsevier Interactive Patient Education  2017 [employer].

## 2023-06-12 ENCOUNTER — Encounter: Payer: Self-pay | Admitting: Family Medicine

## 2023-06-12 ENCOUNTER — Other Ambulatory Visit: Payer: Self-pay | Admitting: Family Medicine

## 2023-07-10 ENCOUNTER — Encounter: Payer: Self-pay | Admitting: Family Medicine

## 2023-07-10 NOTE — Telephone Encounter (Signed)
 Patient missed fosamax  dose Friday and had to take it Monday (today) patient is asking if she can return to Friday dose or does she now have to remain on Monday dose

## 2023-10-26 DIAGNOSIS — H903 Sensorineural hearing loss, bilateral: Secondary | ICD-10-CM | POA: Diagnosis not present

## 2024-06-11 ENCOUNTER — Encounter
